# Patient Record
Sex: Female | Born: 1995 | Race: Black or African American | Hispanic: No | Marital: Single | Smoking: Former smoker
Health system: Southern US, Community
[De-identification: ages and names within clinical notes are randomized; demographics above are authoritative.]

## PROBLEM LIST (undated history)

## (undated) ENCOUNTER — Emergency Department (HOSPITAL_COMMUNITY): Admission: EM | Payer: BC Managed Care – PPO

## (undated) ENCOUNTER — Inpatient Hospital Stay (HOSPITAL_COMMUNITY): Payer: Self-pay

## (undated) ENCOUNTER — Ambulatory Visit (HOSPITAL_COMMUNITY): Disposition: A | Discharge status: Home / Self Care | Payer: BLUE CROSS/BLUE SHIELD

## (undated) DIAGNOSIS — B999 Unspecified infectious disease: Secondary | ICD-10-CM

## (undated) DIAGNOSIS — I10 Essential (primary) hypertension: Secondary | ICD-10-CM

## (undated) DIAGNOSIS — A749 Chlamydial infection, unspecified: Secondary | ICD-10-CM

## (undated) HISTORY — PX: WISDOM TOOTH EXTRACTION: SHX21

## (undated) HISTORY — PX: INDUCED ABORTION: SHX677

---
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
## 2014-01-22 ENCOUNTER — Ambulatory Visit (INDEPENDENT_AMBULATORY_CARE_PROVIDER_SITE_OTHER): Payer: BC Managed Care – PPO | Admitting: Internal Medicine

## 2014-01-22 VITALS — BP 130/82 | HR 84 | Temp 98.4°F | Resp 18 | Ht 65.75 in | Wt 224.4 lb

## 2014-01-22 DIAGNOSIS — R350 Frequency of micturition: Secondary | ICD-10-CM

## 2014-01-22 DIAGNOSIS — N390 Urinary tract infection, site not specified: Secondary | ICD-10-CM

## 2014-01-22 DIAGNOSIS — R319 Hematuria, unspecified: Secondary | ICD-10-CM

## 2014-01-22 LAB — POCT UA - MICROSCOPIC ONLY
CRYSTALS, UR, HPF, POC: NEGATIVE
Casts, Ur, LPF, POC: NEGATIVE
RBC, urine, microscopic: NEGATIVE
YEAST UA: NEGATIVE

## 2014-01-22 LAB — POCT URINALYSIS DIPSTICK
BILIRUBIN UA: NEGATIVE
GLUCOSE UA: NEGATIVE
NITRITE UA: NEGATIVE
PH UA: 7
Protein, UA: NEGATIVE
Spec Grav, UA: 1.015
Urobilinogen, UA: 0.2

## 2014-01-22 MED ORDER — SULFAMETHOXAZOLE-TMP DS 800-160 MG PO TABS: 1.0000 | ORAL_TABLET | Freq: Two times a day (BID) | ORAL | Status: DC

## 2014-01-22 NOTE — Progress Notes (Signed)
Subjective:    Patient ID: Sabrina Coleman, female    DOB: 1995-12-27, 18 y.o.   MRN: [id number]  HPI This chart was scribed for Ellamae Siaobert Jalasia Eskridge, MD, by Phillis HaggisGabriella Gaje, ED Scribe. This patient was seen in room 3 and the patient's care was started at 4:31 PM.  HPI Comments: Sabrina Coleman is a 18 y.o. female who presents to the [facility] complaining of abdominal pain and urinary frequency-2-3 days Had a UTI where pain was frequent, went on medication where pain subsided 4/15 Reports that she was on Macrobid from that last visit She denies history of kidney stones Drinks dark soda despite being told not to  Denies dyspareunia and dysuria Denies new sexual partner many mos-see neg STI screen Denies family history of kidney stones  uti in 1/15 also---resp to there Holds urine to work Sex activ < q 2weeks-same partner   Review of Systems  Constitutional: Negative for fever, chills, diaphoresis, activity change, appetite change, fatigue and unexpected weight change.  Respiratory: Negative for shortness of breath.   Cardiovascular: Negative for palpitations.  Gastrointestinal: Negative for nausea, vomiting and rectal pain.  Genitourinary: Negative for vaginal bleeding, vaginal discharge, enuresis, genital sores, vaginal pain, menstrual problem and dyspareunia.  Musculoskeletal: Negative for back pain.      Objective:   Physical Exam  Constitutional: She is oriented to person, place, and time. She appears well-developed and well-nourished. No distress.  obese  Eyes: Pupils are equal, round, and reactive to light.  Neck: No thyromegaly present.  Cardiovascular: Normal rate and regular rhythm.   Pulmonary/Chest: Effort normal and breath sounds normal. No respiratory distress.  Abdominal: Soft. Bowel sounds are normal. She exhibits no distension. There is no rebound, no guarding and no CVA tenderness.  Mild LLQ tenderness to deep palpation.   Musculoskeletal: She  exhibits no edema.  Lymphadenopathy:    She has no cervical adenopathy.  Neurological: She is alert and oriented to person, place, and time.   Results for orders placed in visit on 01/22/14  POCT UA - MICROSCOPIC ONLY      Result Value Ref Range   WBC, Ur, HPF, POC 25-30     RBC, urine, microscopic neg     Bacteria, U Microscopic 3+     Mucus, UA moderate     Epithelial cells, urine per micros 13-17     Crystals, Ur, HPF, POC neg     Casts, Ur, LPF, POC neg     Yeast, UA neg    POCT URINALYSIS DIPSTICK      Result Value Ref Range   Color, UA yellow     Clarity, UA cloudy     Glucose, UA neg     Bilirubin, UA neg     Ketones, UA eng     Spec Grav, UA 1.015     Blood, UA small     pH, UA 7.0     Protein, UA neg     Urobilinogen, UA 0.2     Nitrite, UA neg     Leukocytes, UA large (3+)         Assessment & Plan:  Frequent urination -pyuria  Suggests relapse since macrodan 4/15 not sens to bact  Meds ordered this encounter  Medications  . sulfamethoxazole-trimethoprim (BACTRIM DS) 800-160 MG per tablet    Sig: Take 1 tablet by mouth 2 (two) times daily.    Dispense:  20 tablet    Refill:  0  Call after  cult

## 2014-01-25 LAB — URINE CULTURE: Colony Count: >=100000

## 2014-01-30 ENCOUNTER — Encounter: Payer: Self-pay | Admitting: *Deleted

## 2015-05-06 ENCOUNTER — Ambulatory Visit (INDEPENDENT_AMBULATORY_CARE_PROVIDER_SITE_OTHER): Payer: BLUE CROSS/BLUE SHIELD | Admitting: Family Medicine

## 2015-05-06 VITALS — BP 128/72 | HR 62 | Temp 97.9°F | Resp 16 | Ht 65.0 in | Wt 212.0 lb

## 2015-05-06 DIAGNOSIS — Z3049 Encounter for surveillance of other contraceptives: Secondary | ICD-10-CM | POA: Diagnosis not present

## 2015-05-06 DIAGNOSIS — Z23 Encounter for immunization: Secondary | ICD-10-CM | POA: Diagnosis not present

## 2015-05-06 DIAGNOSIS — Z113 Encounter for screening for infections with a predominantly sexual mode of transmission: Secondary | ICD-10-CM | POA: Diagnosis not present

## 2015-05-06 DIAGNOSIS — A599 Trichomoniasis, unspecified: Secondary | ICD-10-CM

## 2015-05-06 DIAGNOSIS — Z3046 Encounter for surveillance of implantable subdermal contraceptive: Secondary | ICD-10-CM

## 2015-05-06 NOTE — Patient Instructions (Addendum)
Come back in 1 year for your pap smear/pelvic. If you meet a nice deserving young man with a good job, come back and see me prior. Leave on your pressure dressing x 1 day. You should then leave the steri strips in place until they naturally fall off - 1 wk. You can wash normally but try not to get them to wet or leave them wet (which will make them come off sooner). If the incision site becomes painful and swollen or a hard mass, then take an aspirin a day and warm wet compress. RTC immed for any increased pain, redness, swelling, or purulent drainage. If you have any questions or concerns, don't hesitate to call.  Contraceptive Implant Information A contraceptive implant is a plastic rod that is inserted under your skin. It is usually inserted under the skin of your upper arm. It continually releases small amounts of progestin (synthetic progesterone) into your bloodstream. This prevents an egg from being released from your ovaries. It also thickens your cervical mucus to prevent sperm from entering the cervix, and it thins your uterine lining to prevent a fertilized egg from attaching to your uterus. Contraceptive implants can be effective for up to 3 years. They do not provide protection against sexually transmitted diseases (STDs).  The procedure to insert an implant usually takes about 10 minutes. There may be minor bruising, swelling, and discomfort at the insertion site for a couple days. The implant begins to work within the first day. Other contraceptive protection may be necessary for 7 days. Be sure to discuss with your health care provider if you need a backup method of contraception.  Your health care provider will make sure you are a good candidate for the contraceptive implant. Discuss with your health care provider the possible side effects of the implant. ADVANTAGES  It prevents pregnancy for up to 3 years.  It is easily reversible.  It is convenient.  It can be used when  breastfeeding.  It can be used by women who cannot take estrogen. DISADVANTAGES  You may have irregular or unplanned vaginal bleeding.  You may develop side effects, including headache, weight gain, acne, breast tenderness, or mood changes.  You may have tissue or nerve damage after insertion (rare).  It may be difficult and uncomfortable to remove.  Certain medicines may interfere with the effectiveness of the implant. REMOVAL OF IMPLANT The implant should be removed in 3 years or as directed by your health care provider. The implant's effect wears off in a few hours after removal. Your ability to get pregnant (fertility) may be restored in 1-2 weeks. A new implant can be inserted as soon as the old one is removed if desired. CONTRAINDICATIONS You should not get the implant if you are experiencing any of the following situations:  You are pregnant.  You have a history of breast cancer, osteoporosis, blood clots, heart disease, diabetes, high blood pressure, liver disease, tumors, or stroke.   You have undiagnosed vaginal bleeding.  You have a sensitivity to any part of the implant.   This information is not intended to replace advice given to you by your health care provider. Make sure you discuss any questions you have with your health care provider.   Document Released: 06/26/2011 Document Revised: 03/09/2013 Document Reviewed: 01/03/2013 Elsevier Interactive Patient Education [employer]  Contraception Choices Contraception (birth control) is the use of any methods or devices to prevent pregnancy. Below are some methods to help avoid pregnancy. HORMONAL METHODS  Contraceptive implant. This is a thin, plastic tube containing progesterone hormone. It does not contain estrogen hormone. Your health care provider inserts the tube in the inner part of the upper arm. The tube can remain in place for up to 3 years. After 3 years, the implant must be removed. The implant  prevents the ovaries from releasing an egg (ovulation), thickens the cervical mucus to prevent sperm from entering the uterus, and thins the lining of the inside of the uterus.  Progesterone-only injections. These injections are given every 3 months by your health care provider to prevent pregnancy. This synthetic progesterone hormone stops the ovaries from releasing eggs. It also thickens cervical mucus and changes the uterine lining. This makes it harder for sperm to survive in the uterus.  Birth control pills. These pills contain estrogen and progesterone hormone. They work by preventing the ovaries from releasing eggs (ovulation). They also cause the cervical mucus to thicken, preventing the sperm from entering the uterus. Birth control pills are prescribed by a health care provider.Birth control pills can also be used to treat heavy periods.  Minipill. This type of birth control pill contains only the progesterone hormone. They are taken every day of each month and must be prescribed by your health care provider.  Birth control patch. The patch contains hormones similar to those in birth control pills. It must be changed once a week and is prescribed by a health care provider.  Vaginal ring. The ring contains hormones similar to those in birth control pills. It is left in the vagina for 3 weeks, removed for 1 week, and then a new one is put back in place. The patient must be comfortable inserting and removing the ring from the vagina.A health care provider's prescription is necessary.  Emergency contraception. Emergency contraceptives prevent pregnancy after unprotected sexual intercourse. This pill can be taken right after sex or up to 5 days after unprotected sex. It is most effective the sooner you take the pills after having sexual intercourse. Most emergency contraceptive pills are available without a prescription. Check with your pharmacist. Do not use emergency contraception as your only  form of birth control. BARRIER METHODS   Female condom. This is a thin sheath (latex or rubber) that is worn over the penis during sexual intercourse. It can be used with spermicide to increase effectiveness.  Female condom. This is a soft, loose-fitting sheath that is put into the vagina before sexual intercourse.  Diaphragm. This is a soft, latex, dome-shaped barrier that must be fitted by a health care provider. It is inserted into the vagina, along with a spermicidal jelly. It is inserted before intercourse. The diaphragm should be left in the vagina for 6 to 8 hours after intercourse.  Cervical cap. This is a round, soft, latex or plastic cup that fits over the cervix and must be fitted by a health care provider. The cap can be left in place for up to 48 hours after intercourse.  Sponge. This is a soft, circular piece of polyurethane foam. The sponge has spermicide in it. It is inserted into the vagina after wetting it and before sexual intercourse.  Spermicides. These are chemicals that kill or block sperm from entering the cervix and uterus. They come in the form of creams, jellies, suppositories, foam, or tablets. They do not require a prescription. They are inserted into the vagina with an applicator before having sexual intercourse. The process must be repeated every time you have sexual intercourse. INTRAUTERINE CONTRACEPTION  Intrauterine device (IUD). This is a T-shaped device that is put in a woman's uterus during a menstrual period to prevent pregnancy. There are 2 types:  Copper IUD. This type of IUD is wrapped in copper wire and is placed inside the uterus. Copper makes the uterus and fallopian tubes produce a fluid that kills sperm. It can stay in place for 10 years.  Hormone IUD. This type of IUD contains the hormone progestin (synthetic progesterone). The hormone thickens the cervical mucus and prevents sperm from entering the uterus, and it also thins the uterine lining to  prevent implantation of a fertilized egg. The hormone can weaken or kill the sperm that get into the uterus. It can stay in place for 3-5 years, depending on which type of IUD is used. PERMANENT METHODS OF CONTRACEPTION  Female tubal ligation. This is when the woman's fallopian tubes are surgically sealed, tied, or blocked to prevent the egg from traveling to the uterus.  Hysteroscopic sterilization. This involves placing a small coil or insert into each fallopian tube. Your doctor uses a technique called hysteroscopy to do the procedure. The device causes scar tissue to form. This results in permanent blockage of the fallopian tubes, so the sperm cannot fertilize the egg. It takes about 3 months after the procedure for the tubes to become blocked. You must use another form of birth control for these 3 months.  Female sterilization. This is when the female has the tubes that carry sperm tied off (vasectomy).This blocks sperm from entering the vagina during sexual intercourse. After the procedure, the man can still ejaculate fluid (semen). NATURAL PLANNING METHODS  Natural family planning. This is not having sexual intercourse or using a barrier method (condom, diaphragm, cervical cap) on days the woman could become pregnant.  Calendar method. This is keeping track of the length of each menstrual cycle and identifying when you are fertile.  Ovulation method. This is avoiding sexual intercourse during ovulation.  Symptothermal method. This is avoiding sexual intercourse during ovulation, using a thermometer and ovulation symptoms.  Post-ovulation method. This is timing sexual intercourse after you have ovulated. Regardless of which type or method of contraception you choose, it is important that you use condoms to protect against the transmission of sexually transmitted infections (STIs). Talk with your health care provider about which form of contraception is most appropriate for you.   This  information is not intended to replace advice given to you by your health care provider. Make sure you discuss any questions you have with your health care provider.   Document Released: 07/07/2005 Document Revised: 07/12/2013 Document Reviewed: 12/30/2012 Elsevier Interactive Patient Education 2016 [employer]. Oral Contraception Information Oral contraceptive pills (OCPs) are medicines taken to prevent pregnancy. OCPs work by preventing the ovaries from releasing eggs. The hormones in OCPs also cause the cervical mucus to thicken, preventing the sperm from entering the uterus. The hormones also cause the uterine lining to become thin, not allowing a fertilized egg to attach to the inside of the uterus. OCPs are highly effective when taken exactly as prescribed. However, OCPs do not prevent sexually transmitted diseases (STDs). Safe sex practices, such as using condoms along with the pill, can help prevent STDs.  Before taking the pill, you may have a physical exam and Pap test. Your health care provider may order blood tests. The health care provider will make sure you are a good candidate for oral contraception. Discuss with your health care provider the possible side effects of  the OCP you may be prescribed. When starting an OCP, it can take 2 to 3 months for the body to adjust to the changes in hormone levels in your body.  TYPES OF ORAL CONTRACEPTION  The combination pill--This pill contains estrogen and progestin (synthetic progesterone) hormones. The combination pill comes in 21-day, 28-day, or 91-day packs. Some types of combination pills are meant to be taken continuously (365-day pills). With 21-day packs, you do not take pills for 7 days after the last pill. With 28-day packs, the pill is taken every day. The last 7 pills are without hormones. Certain types of pills have more than 21 hormone-containing pills. With 91-day packs, the first 84 pills contain both hormones, and the last 7 pills  contain no hormones or contain estrogen only.  The minipill--This pill contains the progesterone hormone only. The pill is taken every day continuously. It is very important to take the pill at the same time each day. The minipill comes in packs of 28 pills. All 28 pills contain the hormone.  ADVANTAGES OF ORAL CONTRACEPTIVE PILLS  Decreases premenstrual symptoms.   Treats menstrual period cramps.   Regulates the menstrual cycle.   Decreases a heavy menstrual flow.   May treatacne, depending on the type of pill.   Treats abnormal uterine bleeding.   Treats polycystic ovarian syndrome.   Treats endometriosis.   Can be used as emergency contraception.  THINGS THAT CAN MAKE ORAL CONTRACEPTIVE PILLS LESS EFFECTIVE OCPs can be less effective if:   You forget to take the pill at the same time every day.   You have a stomach or intestinal disease that lessens the absorption of the pill.   You take OCPs with other medicines that make OCPs less effective, such as antibiotics, certain HIV medicines, and some seizure medicines.   You take expired OCPs.   You forget to restart the pill on day 7, when using the packs of 21 pills.  RISKS ASSOCIATED WITH ORAL CONTRACEPTIVE PILLS  Oral contraceptive pills can sometimes cause side effects, such as:  Headache.  Nausea.  Breast tenderness.  Irregular bleeding or spotting. Combination pills are also associated with a small increased risk of:  Blood clots.  Heart attack.  Stroke.   This information is not intended to replace advice given to you by your health care provider. Make sure you discuss any questions you have with your health care provider.   Document Released: 09/27/2002 Document Revised: 04/27/2013 Document Reviewed: 12/26/2012 Elsevier Interactive Patient Education [employer]

## 2015-05-06 NOTE — Progress Notes (Signed)
Subjective:  This chart was scribed for Sabrina Sorenson, MD by Sabrina Coleman, Medical Scribe. This patient was seen in Room 8 and the patient's care was started 4:27 PM.    Patient ID: Sabrina Coleman, female    DOB: 01-02-1996, 19 y.o.   MRN: [id number] Chief Complaint  Patient presents with  . explanon removal    pt would like to get implant removed  . std check  . Immunizations    flu shot    HPI Sabrina Coleman is a 19 y.o. female who presents to [facility] requesting nexplanon removal. She's had it in since Oct 2014 but for the past 2 mos she has been having increasingly freq burning around the rod. She plans on just using condoms for birth control. She denies being in a relationship at the moment. She wants to have STD check.  She was on Depo prior but had a lot of weight gain w/ that. No weight gain on nexplanon but is having spotting and bleeding after intercourse which she dislikes immensely.  She received a flu vaccine today.   History reviewed. No pertinent past medical history. Prior to Admission medications   Medication Sig Start Date End Date Taking? Authorizing Provider  etonogestrel (NEXPLANON) 68 MG IMPL implant Inject 1 each into the skin once.    Historical Provider, MD  fluconazole (DIFLUCAN) 150 MG tablet Take 1 tablet (150 mg total) by mouth once. 11/15/13   Sabrina Pearson, MD  nitrofurantoin, macrocrystal-monohydrate, (MACROBID) 100 MG capsule Take 1 capsule (100 mg total) by mouth 2 (two) times daily. 11/15/13   Sabrina Pearson, MD  sulfamethoxazole-trimethoprim (BACTRIM DS) 800-160 MG per tablet Take 1 tablet by mouth 2 (two) times daily. 01/22/14   Sabrina Pearson, MD   No Known Allergies   Review of Systems  Constitutional: Negative for fever, chills, diaphoresis and fatigue.  Respiratory: Negative for cough, chest tightness, shortness of breath and wheezing.   Gastrointestinal: Negative for nausea, vomiting, diarrhea and constipation.  Genitourinary: Positive  for vaginal bleeding and menstrual problem. Negative for dyspareunia.  Musculoskeletal: Positive for myalgias. Negative for joint swelling.  Skin: Negative for rash and wound.       Objective:   Physical Exam  Constitutional: She is oriented to person, place, and time. She appears well-developed and well-nourished. No distress.  HENT:  Head: Normocephalic and atraumatic.  Eyes: EOM are normal. Pupils are equal, round, and reactive to light.  Neck: Neck supple.  Cardiovascular: Normal rate.   Pulmonary/Chest: Effort normal. No respiratory distress.  Musculoskeletal: Normal range of motion.  Neurological: She is alert and oriented to person, place, and time.  Skin: Skin is warm and dry.  Psychiatric: She has a normal mood and affect. Her behavior is normal.  Nursing note and vitals reviewed.   BP 128/72 mmHg  Pulse 62  Temp(Src) 97.9 F (36.6 C) (Oral)  Resp 16  Ht  (1.651 m)  Wt 212 lb (96.163 kg)  BMI 35.28 kg/m2  SpO2 98%       Assessment & Plan:   1. Family planning, subdermal contraceptive checking/reinsertion/removal - pt with new burning around nexplanon placed 2 yrs prior and not currently sexually active so would like nexplanon removed today which we did w/o sig complication - pt tolerated procedure well. Going to practice abstinence for now and will rtc ot start other form of contraception prior to resumption of sexual activity as well as using condoms every time but gained weight on depo  so wants to trial off hormones for now.   2. Screen for STD (sexually transmitted disease) - last pelvic 2 yrs ago, rtc in 1 yr after 20st birthday for pap  3. Need for influenza vaccination     Orders Placed This Encounter  Procedures  . GC/Chlamydia Probe Amp  . Trichomonas vaginalis, RNA  . Flu Vaccine QUAD 36+ mos IM  . HIV antibody  . Hepatitis C antibody  . RPR     I personally performed the services described in this documentation, which was scribed in my  presence. The recorded information has been reviewed and considered, and addended by me as needed.  Sabrina SorensonEva Avriana Joo, MD MPH   By signing my name below, I, Sabrina Oresung-Kai Coleman, attest that this documentation has been prepared under the direction and in the presence of Sabrina SorensonEva Natalye Kott, MD. Electronically Signed: Stann Oresung-Kai Coleman, Scribe. 05/06/2015 , 4:27 PM .

## 2015-05-07 LAB — RPR

## 2015-05-07 LAB — HEPATITIS C ANTIBODY: HCV Ab: NEGATIVE

## 2015-05-08 LAB — HIV ANTIBODY (ROUTINE TESTING W REFLEX): HIV: NONREACTIVE

## 2015-05-08 LAB — GC/CHLAMYDIA PROBE AMP
CT PROBE, AMP APTIMA: NEGATIVE
GC PROBE AMP APTIMA: NEGATIVE

## 2015-05-08 LAB — TRICHOMONAS VAGINALIS, PROBE AMP: T vaginalis RNA: POSITIVE — AB

## 2015-05-09 MED ORDER — METRONIDAZOLE 500 MG PO TABS: 2000.0000 mg | ORAL_TABLET | Freq: Once | ORAL | Status: DC

## 2015-05-09 NOTE — Addendum Note (Signed)
Addended by: Norberto SorensonSHAW, Jannessa Ogden on: 05/09/2015 05:44 AM   Modules accepted: Orders

## 2015-08-16 ENCOUNTER — Ambulatory Visit (INDEPENDENT_AMBULATORY_CARE_PROVIDER_SITE_OTHER): Payer: BLUE CROSS/BLUE SHIELD | Admitting: Physician Assistant

## 2015-08-16 VITALS — BP 120/80 | HR 88 | Temp 97.7°F | Resp 16 | Ht 67.0 in | Wt 195.4 lb

## 2015-08-16 DIAGNOSIS — Z130 Encounter for screening for diseases of the blood and blood-forming organs and certain disorders involving the immune mechanism: Secondary | ICD-10-CM | POA: Diagnosis not present

## 2015-08-16 DIAGNOSIS — N898 Other specified noninflammatory disorders of vagina: Secondary | ICD-10-CM

## 2015-08-16 DIAGNOSIS — Z113 Encounter for screening for infections with a predominantly sexual mode of transmission: Secondary | ICD-10-CM

## 2015-08-16 DIAGNOSIS — Z13228 Encounter for screening for other metabolic disorders: Secondary | ICD-10-CM

## 2015-08-16 DIAGNOSIS — Z0000 Encounter for general adult medical examination without abnormal findings: Secondary | ICD-10-CM

## 2015-08-16 DIAGNOSIS — R070 Pain in throat: Secondary | ICD-10-CM | POA: Diagnosis not present

## 2015-08-16 DIAGNOSIS — Z124 Encounter for screening for malignant neoplasm of cervix: Secondary | ICD-10-CM | POA: Diagnosis not present

## 2015-08-16 DIAGNOSIS — Z1329 Encounter for screening for other suspected endocrine disorder: Secondary | ICD-10-CM

## 2015-08-16 LAB — POCT WET + KOH PREP
Trich by wet prep: ABSENT
YEAST BY KOH: ABSENT
YEAST BY WET PREP: ABSENT

## 2015-08-16 LAB — COMPLETE METABOLIC PANEL WITH GFR
ALT: 9 U/L (ref 6–29)
AST: 12 U/L (ref 10–30)
Albumin: 4 g/dL (ref 3.6–5.1)
Alkaline Phosphatase: 72 U/L (ref 33–115)
BUN: 9 mg/dL (ref 7–25)
CALCIUM: 9.3 mg/dL (ref 8.6–10.2)
CHLORIDE: 103 mmol/L (ref 98–110)
CO2: 25 mmol/L (ref 20–31)
Creat: 0.78 mg/dL (ref 0.50–1.10)
GFR, Est Non African American: >89 mL/min (ref >=60)
Glucose, Bld: 72 mg/dL (ref 65–99)
POTASSIUM: 3.8 mmol/L (ref 3.5–5.3)
Sodium: 140 mmol/L (ref 135–146)
Total Bilirubin: 0.6 mg/dL (ref 0.2–1.2)
Total Protein: 7.7 g/dL (ref 6.1–8.1)

## 2015-08-16 LAB — CBC
HEMATOCRIT: 40 % (ref 36.0–46.0)
Hemoglobin: 13.2 g/dL (ref 12.0–15.0)
MCH: 31.3 pg (ref 26.0–34.0)
MCHC: 33 g/dL (ref 30.0–36.0)
MCV: 94.8 fL (ref 78.0–100.0)
MPV: 10.4 fL (ref 8.6–12.4)
PLATELETS: 301 10*3/uL (ref 150–400)
RBC: 4.22 MIL/uL (ref 3.87–5.11)
RDW: 13.1 % (ref 11.5–15.5)
WBC: 7.7 10*3/uL (ref 4.0–10.5)

## 2015-08-16 LAB — TSH: TSH: 2.36 u[IU]/mL (ref 0.350–4.500)

## 2015-08-16 LAB — POCT RAPID STREP A (OFFICE): RAPID STREP A SCREEN: NEGATIVE

## 2015-08-16 LAB — POCT URINE PREGNANCY: Preg Test, Ur: NEGATIVE

## 2015-08-16 NOTE — Progress Notes (Signed)
[facility] [address] [place] [address] [phone]  Date:  08/16/2015   Name:  Chastin Garlitz   DOB:  Jun 21, 1996   MRN:  [id number]  PCP:  No PCP Per Patient    History of Present Illness:  Maelynn Moroney is a 20 y.o. female patient who presents to [facility] annual physical exam and a test of cure for trichomoniasis.       She had a hx of trichomoniasis 3 months ago, and would like to be rechecked. She now has discharge more often than normal.  Whitish odorless.   Throat pain and bilateral ear pain about 1 month ago, that has left her with a residual hoarseness and sensation in her throat.  Sore throat that lasted for days.  She took 1 amoxicillin for relief.  She had no fever.  The ear pain lasted for several days.  She used sweet oil where she found relief.   Diet: No restrictions.  She is eating everything.  She is not having vegetables daily and may have it every once "in a blue moon".  Hydration is bad.  She tries to drink one cup of water per day.  She drinks a lot of juice and soda.    BM: Normal. Qd, no constipation, diarrhea, or blood in the stool.    Urination: No hematuria, dysuria, or hematuria.   Sleep: No trouble sleeping.  Gets 8 hours.  Work: Chemical engineer.  Spend time with family.  Studying criminal justice in 3rd year.  Transferring [employer].  She will be going to [employer].    EtOH use: very rare "blue moon" Tobacco use: no tobacco use Illicit drug use: cannabis, "alot" multiple times per day.    Sexually active: none at this time.           Patient Active Problem List   Diagnosis Date Noted  . BMI 35.0-35.9,adult 11/15/2013    History reviewed. No pertinent past medical history.  History reviewed. No pertinent past surgical history.  Social History  Substance Use Topics  . Smoking status: Never Smoker   . Smokeless tobacco: None  . Alcohol Use: No    Family History  Problem Relation Age of Onset  . Cancer Maternal  Grandmother     No Known Allergies  Medication list has been reviewed and updated.  Current Outpatient Prescriptions on File Prior to Visit  Medication Sig Dispense Refill  . etonogestrel (NEXPLANON) 68 MG IMPL implant Inject 1 each into the skin once. Reported on 08/16/2015    . fluconazole (DIFLUCAN) 150 MG tablet Take 1 tablet (150 mg total) by mouth once. (Patient not taking: Reported on 05/06/2015) 1 tablet 1  . metroNIDAZOLE (FLAGYL) 500 MG tablet Take 4 tablets (2,000 mg total) by mouth once. (Patient not taking: Reported on 08/16/2015) 4 tablet 0  . nitrofurantoin, macrocrystal-monohydrate, (MACROBID) 100 MG capsule Take 1 capsule (100 mg total) by mouth 2 (two) times daily. (Patient not taking: Reported on 05/06/2015) 14 capsule 0  . sulfamethoxazole-trimethoprim (BACTRIM DS) 800-160 MG per tablet Take 1 tablet by mouth 2 (two) times daily. (Patient not taking: Reported on 05/06/2015) 20 tablet 0   No current facility-administered medications on file prior to visit.    Review of Systems  Constitutional: Negative for fever and chills.  HENT: Positive for ear pain and sore throat. Negative for ear discharge.   Eyes: Negative for blurred vision and double vision.  Respiratory: Negative for cough, shortness of breath  and wheezing.   Cardiovascular: Negative for chest pain, palpitations and leg swelling.  Gastrointestinal: Negative for nausea, vomiting and diarrhea.  Genitourinary: Negative for dysuria, frequency and hematuria.  Skin: Negative for itching and rash.  Neurological: Negative for dizziness and headaches.   Physical Examination: BP 120/80 mmHg  Pulse 88  Temp(Src) 97.7 F (36.5 C) (Oral)  Resp 16  Ht  (1.702 m)  Wt 195 lb 6.4 oz (88.633 kg)  BMI 30.60 kg/m2  SpO2 97%  LMP 07/31/2015 Ideal Body Weight: Weight in (lb) to have BMI = 25: 159.3  Physical Exam  Constitutional: She is oriented to person, place, and time. She appears well-developed and  well-nourished. No distress.  HENT:  Head: Normocephalic and atraumatic.  Right Ear: Tympanic membrane, external ear and ear canal normal.  Left Ear: Tympanic membrane, external ear and ear canal normal.  Nose: Mucosal edema and rhinorrhea present. Right sinus exhibits no maxillary sinus tenderness and no frontal sinus tenderness. Left sinus exhibits no maxillary sinus tenderness and no frontal sinus tenderness.  Mouth/Throat: Oropharynx is clear and moist. No uvula swelling. No oropharyngeal exudate, posterior oropharyngeal edema or posterior oropharyngeal erythema.  Eyes: Conjunctivae and EOM are normal. Pupils are equal, round, and reactive to light.  Neck: Normal range of motion. Neck supple. No thyromegaly present.  Cardiovascular: Normal rate, regular rhythm, normal heart sounds and intact distal pulses.  Exam reveals no gallop, no distant heart sounds and no friction rub.   No murmur heard. Pulmonary/Chest: Effort normal and breath sounds normal. No respiratory distress. She has no decreased breath sounds. She has no wheezes. She has no rhonchi.  Abdominal: Soft. Bowel sounds are normal. She exhibits no distension and no mass. There is no tenderness.  Genitourinary: Pelvic exam was performed with patient supine. There is no rash on the right labia. There is no rash on the left labia. Cervix exhibits discharge and friability. Cervix exhibits no motion tenderness. Right adnexum displays no mass and no tenderness. Left adnexum displays no mass and no tenderness. Vaginal discharge found.  Substantial erythema surrounding the cervical os.  Whitish yellow discharge within the vaginal vault, but none from cervix at this time.    Musculoskeletal: Normal range of motion. She exhibits no edema or tenderness.  Lymphadenopathy:       Head (right side): No submandibular, no tonsillar, no preauricular and no posterior auricular adenopathy present.       Head (left side): No submandibular, no tonsillar, no  preauricular and no posterior auricular adenopathy present.    She has no cervical adenopathy.  Neurological: She is alert and oriented to person, place, and time. No cranial nerve deficit. She exhibits normal muscle tone. Coordination normal.  Skin: Skin is warm and dry. She is not diaphoretic.  Psychiatric: She has a normal mood and affect. Her behavior is normal.    Results for orders placed or performed in visit on 08/16/15  POCT rapid strep A  Result Value Ref Range   Rapid Strep A Screen Negative Negative  POCT Wet + KOH Prep  Result Value Ref Range   Yeast by KOH Absent Present, Absent   Yeast by wet prep Absent Present, Absent   WBC by wet prep Moderate (A) None, Few, Too numerous to count   Clue Cells Wet Prep HPF POC None None, Too numerous to count   Trich by wet prep Absent Present, Absent   Bacteria Wet Prep HPF POC Few None, Few, Too numerous to count  Epithelial Cells By [employer] Pref ([facility]FC) Moderate (A) None, Few, Too numerous to count   RBC,UR,HPF,POC None None RBC/hpf    Assessment and Plan: Ronia Hazelett is a 20 y.o. female who is here today for an annual physical exam, concern of strep throat, and test of cure for trichomoniasis. -placing pap, and will await results.  This could be trichomoniasis.  We will also culture strep at this time.     Annual physical exam - Plan: CBC, COMPLETE METABOLIC PANEL WITH GFR, HIV antibody, TSH, RPR, Trichomonas vaginalis, RNA, POCT Wet + KOH Prep, Pap IG and Chlamydia/Gonococcus, NAA, POCT urine pregnancy  Throat pain - Plan: POCT rapid strep A, Culture, Group A Strep  Screening for thyroid disorder - Plan: TSH  Screening for STD (sexually transmitted disease) - Plan: HIV antibody, RPR, Trichomonas vaginalis, RNA, Pap IG and Chlamydia/Gonococcus, NAA  Screening for metabolic disorder - Plan: COMPLETE METABOLIC PANEL WITH GFR  Screening for deficiency anemia - Plan: CBC  Vaginal discharge - Plan: POCT Wet + KOH Prep, POCT urine  pregnancy   Trena Platt, PA-C [facility] 1/26/20174:14 PM

## 2015-08-16 NOTE — Patient Instructions (Signed)

## 2015-08-17 LAB — PAP IG AND CT-NG NAA
CHLAMYDIA PROBE AMP: DETECTED — AB
GC Probe Amp: NOT DETECTED

## 2015-08-17 LAB — RPR

## 2015-08-17 LAB — HIV ANTIBODY (ROUTINE TESTING W REFLEX): HIV: NONREACTIVE

## 2015-08-17 LAB — TRICHOMONAS VAGINALIS, PROBE AMP: TRICHOMONAS VAGINALIS PROBE APTIMA: NEGATIVE

## 2015-08-18 LAB — CULTURE, GROUP A STREP: ORGANISM ID, BACTERIA: NORMAL

## 2015-08-19 ENCOUNTER — Encounter: Payer: Self-pay | Admitting: Physician Assistant

## 2015-08-23 ENCOUNTER — Other Ambulatory Visit: Payer: Self-pay | Admitting: Physician Assistant

## 2015-08-23 DIAGNOSIS — A749 Chlamydial infection, unspecified: Secondary | ICD-10-CM

## 2015-08-23 MED ORDER — AZITHROMYCIN 250 MG PO TABS: 1000.0000 mg | ORAL_TABLET | Freq: Once | ORAL | Status: DC

## 2015-08-23 NOTE — Progress Notes (Signed)
PLEASE send chlamydia result to [facility].  Mother is on ROI, 10/16.  Patient's phone is off.  Left message of chlamydia with mother.  Treatment sent to pharmacy.  Trich, RPR, HIV negative.  Normal kidney, and liver function.  Thyroid function is normal.  Mother had confided that patient has been an A Consulting civil engineer in college.  She started dating a young man, who engaged in cannabis use where she picked up a strong habit.  She now has dropped out of school.  She lives on her own.   Advised that patient schedule with me next door for 15 minute session.

## 2015-08-27 NOTE — Progress Notes (Signed)
I have [facility] to [facility]

## 2015-09-24 ENCOUNTER — Ambulatory Visit (INDEPENDENT_AMBULATORY_CARE_PROVIDER_SITE_OTHER): Payer: BLUE CROSS/BLUE SHIELD | Admitting: Physician Assistant

## 2015-09-24 VITALS — BP 116/65 | HR 60 | Temp 97.7°F | Resp 16 | Ht 65.5 in | Wt 186.4 lb

## 2015-09-24 DIAGNOSIS — N926 Irregular menstruation, unspecified: Secondary | ICD-10-CM

## 2015-09-24 DIAGNOSIS — Z3049 Encounter for surveillance of other contraceptives: Secondary | ICD-10-CM | POA: Diagnosis not present

## 2015-09-24 DIAGNOSIS — Z113 Encounter for screening for infections with a predominantly sexual mode of transmission: Secondary | ICD-10-CM | POA: Diagnosis not present

## 2015-09-24 LAB — POCT URINE PREGNANCY: PREG TEST UR: NEGATIVE

## 2015-09-24 MED ORDER — ETONOGESTREL-ETHINYL ESTRADIOL 0.12-0.015 MG/24HR VA RING: VAGINAL_RING | VAGINAL | Status: DC

## 2015-09-24 NOTE — Progress Notes (Signed)
[facility] [address], [place] [id number] [phone]  Date:  09/24/2015   Name:  Sabrina Coleman   DOB:  08/07/1995   MRN:  [id number]  PCP:  No PCP Per Patient    Chief Complaint: Possible Pregnancy and std check   History of Present Illness:  This is a 20 y.o. female with PMH obesity who is presenting with menstrual irregularity. Pt was supposed to start period 1 week ago. A couple days after she was supposed to start, she got some spotting. 2 days later that stopped. Started spotting again today. This is not normal for her. She is not having any premenstrual symptoms. She denies abdominal cramping, breast tenderness, back pain. Pt got nexplanon removed 04/2015 but did not get replaced. Has not been taking anything for birth control since. Had regularly monthly periods after getting nexplanon removed. Pt states she had a lot of sex last month with a new female partner. They did not use condoms. She is very worried she is pregnant. She is wanting STD testing today. Last had STD testing 04/2015 and was positive for trich, treated. She is interested in starting nuva ring today. She denies vaginal discharge, dysuria, urinary freq.  LMP 1/31 - 2/7.  Review of Systems:  Review of Systems See HPI  Patient Active Problem List   Diagnosis Date Noted  . BMI 35.0-35.9,adult 11/15/2013    Prior to Admission medications   Medication Sig Start Date End Date Taking? Authorizing Provider  etonogestrel (NEXPLANON) 68 MG IMPL implant Inject 1 each into the skin once. Reported on 08/16/2015    Historical Provider, MD  fluconazole (DIFLUCAN) 150 MG tablet Take 1 tablet (150 mg total) by mouth once. Patient not taking: Reported on 05/06/2015 11/15/13   Tonye Pearsonobert P Doolittle, MD    No Known Allergies  History reviewed. No pertinent past surgical history.  Social History  Substance Use Topics  . Smoking status: Never Smoker   . Smokeless tobacco: None  . Alcohol Use: No     Family History  Problem Relation Age of Onset  . Cancer Maternal Grandmother     Medication list has been reviewed and updated.  Physical Examination:  Physical Exam  Constitutional: She is oriented to person, place, and time. She appears well-developed and well-nourished. No distress.  HENT:  Head: Normocephalic and atraumatic.  Right Ear: Hearing normal.  Left Ear: Hearing normal.  Nose: Nose normal.  Eyes: Conjunctivae and lids are normal. Right eye exhibits no discharge. Left eye exhibits no discharge. No scleral icterus.  Pulmonary/Chest: Effort normal. No respiratory distress.  Musculoskeletal: Normal range of motion.  Neurological: She is alert and oriented to person, place, and time.  Skin: Skin is warm, dry and intact. No lesion and no rash noted.  Psychiatric: She has a normal mood and affect. Her speech is normal and behavior is normal. Thought content normal.    BP 116/65 mmHg  Pulse 60  Temp(Src) 97.7 F (36.5 C) (Oral)  Resp 16  Ht 5' 5.5" (1.664 m)  Wt 186 lb 6.4 oz (84.55 kg)  BMI 30.54 kg/m2  LMP 09/21/2015  Assessment and Plan:  1. Missed period 2. Screen for STD 3. Encounter for surveillance of other contraceptive Urine pregnancy negative. Serum HCG negative. STD testing negative. She wishes to start on nuvaring today. We discussed the importance of safe sex practices. Return in 1 year for refill nuvaring or sooner if needed. - POCT urine pregnancy - hCG, quantitative,  pregnancy - HIV antibody - RPR - GC/Chlamydia Probe Amp - Trichomonas vaginalis, RNA - etonogestrel-ethinyl estradiol (NUVARING) 0.12-0.015 MG/24HR vaginal ring; Insert vaginally and leave in place for 3 consecutive weeks, then remove for 1 week.  Dispense: 1 each; Refill: 12   Nicole V. Dyke Brackett, MHS [facility]  10/03/2015

## 2015-09-24 NOTE — Patient Instructions (Signed)
SAFE SEX!! Insert nuvaring and keep inside for 3 weeks. Take out for 4th week, then re-insert. Use back up birth control for first month. Use condoms for new partners especially to protect against STDs i will call you with the results of your lab tests.

## 2015-09-25 LAB — HCG, QUANTITATIVE, PREGNANCY: hCG, Beta Chain, Quant, S: <2 m[IU]/mL

## 2015-09-25 LAB — HIV ANTIBODY (ROUTINE TESTING W REFLEX): HIV: NONREACTIVE

## 2015-09-26 ENCOUNTER — Telehealth: Payer: Self-pay | Admitting: *Deleted

## 2015-09-26 LAB — GC/CHLAMYDIA PROBE AMP
CT PROBE, AMP APTIMA: NOT DETECTED
GC PROBE AMP APTIMA: NOT DETECTED

## 2015-09-26 LAB — RPR

## 2015-09-26 LAB — TRICHOMONAS VAGINALIS, PROBE AMP: TRICHOMONAS VAGINALIS PROBE APTIMA: NEGATIVE

## 2015-09-26 NOTE — Telephone Encounter (Signed)
Pt calling for lab results.

## 2015-09-27 NOTE — Telephone Encounter (Signed)
Pt called again about her labs.  I advised the pt that the doc has not reviewed them yet.  Pt understood.

## 2015-09-27 NOTE — Telephone Encounter (Signed)
Please call pt and let her know all testing negative. 1. STD testing negative 2. She is not pregnant.

## 2015-09-27 NOTE — Telephone Encounter (Signed)
Notified pt of results 

## 2015-10-25 ENCOUNTER — Inpatient Hospital Stay (HOSPITAL_COMMUNITY)
Admission: AD | Admit: 2015-10-25 | Discharge: 2015-10-25 | Disposition: A | Discharge status: Home / Self Care | Payer: Self-pay | Source: Ambulatory Visit | Attending: Obstetrics & Gynecology | Admitting: Obstetrics & Gynecology

## 2015-10-25 DIAGNOSIS — Z809 Family history of malignant neoplasm, unspecified: Secondary | ICD-10-CM | POA: Insufficient documentation

## 2015-10-25 DIAGNOSIS — N912 Amenorrhea, unspecified: Secondary | ICD-10-CM | POA: Insufficient documentation

## 2015-10-25 NOTE — MAU Provider Note (Signed)
  History     CSN: [id number]  Arrival date and time: 10/25/15 1951   None     No chief complaint on file.  HPI Comments: Sabrina Coleman is a 20 y.o. Who presents today for pregnancy verification. She states that she took a pregnancy test about one hour PTA, and it was positive. She denies any VB or abdominal pain.   Possible Pregnancy This is a new problem. The current episode started today. The problem has been unchanged. Pertinent negatives include no abdominal pain, fever, nausea or vomiting. Nothing aggravates the symptoms. She has tried nothing for the symptoms.   No past medical history on file.  No past surgical history on file.  Family History  Problem Relation Age of Onset  . Cancer Maternal Grandmother     Social History  Substance Use Topics  . Smoking status: Never Smoker   . Smokeless tobacco: Not on file  . Alcohol Use: No    Allergies: No Known Allergies  No prescriptions prior to admission    Review of Systems  Constitutional: Negative for fever.  Gastrointestinal: Negative for nausea, vomiting, abdominal pain, diarrhea and constipation.  Genitourinary: Negative for dysuria, urgency and frequency.   Physical Exam   There were no vitals taken for this visit.  Physical Exam  Nursing note and vitals reviewed. Constitutional: She is oriented to person, place, and time. She appears well-developed and well-nourished. No distress.  HENT:  Head: Normocephalic.  Cardiovascular: Normal rate.   Respiratory: Effort normal.  Musculoskeletal: Normal range of motion.  Neurological: She is alert and oriented to person, place, and time.  Psychiatric: She has a normal mood and affect.    MAU Course  Procedures  MDM D/W the patient that we no longer provide pregnancy verification here in MAU. No emergecy condition present at this time. No abdominal pain, no vaginal bleeding. Patient referred to the clinic for pregnancy verification.   Assessment and Plan    1. Amenorrhea    DC home Comfort measures reviewed  1st Trimester precautions  Fetal kick counts RX: none   Follow-up Information    [facility].   Specialty:  Obstetrics and Gynecology   Contact information:   [address] [place] [id number] [phone]        Tawnya CrookHogan, Heather Donovan 10/25/2015, 8:54 PM

## 2015-10-25 NOTE — MAU Note (Addendum)
PT SAYS  SHE  TOOK UPT AT HOME  AT 7PM-   WAS  POSITIVE.  -   WANTS  TO KNOW    IF   PREG.     HEATHER , CNM IN TRIAGE   WITH ASSESSMENT  NOW.

## 2015-10-26 ENCOUNTER — Ambulatory Visit (INDEPENDENT_AMBULATORY_CARE_PROVIDER_SITE_OTHER): Payer: Self-pay

## 2015-10-26 ENCOUNTER — Encounter: Payer: Self-pay | Admitting: Obstetrics and Gynecology

## 2015-10-26 DIAGNOSIS — Z3201 Encounter for pregnancy test, result positive: Secondary | ICD-10-CM

## 2015-10-26 LAB — POCT PREGNANCY, URINE: PREG TEST UR: POSITIVE — AB

## 2015-10-26 NOTE — Progress Notes (Signed)
Pt presents to clinic today for pregnancy test. Test confirms she is pregnant at [redacted] wks 1day. Pt has been given a letter of verification stating she is currently pregnant. Pt plans to follow up with [facility].

## 2015-11-01 ENCOUNTER — Emergency Department (HOSPITAL_COMMUNITY)
Admission: EM | Admit: 2015-11-01 | Discharge: 2015-11-01 | Disposition: A | Discharge status: Home / Self Care | Payer: BLUE CROSS/BLUE SHIELD | Attending: Emergency Medicine | Admitting: Emergency Medicine

## 2015-11-01 ENCOUNTER — Encounter (HOSPITAL_COMMUNITY): Payer: Self-pay | Admitting: Emergency Medicine

## 2015-11-01 DIAGNOSIS — O2340 Unspecified infection of urinary tract in pregnancy, unspecified trimester: Secondary | ICD-10-CM | POA: Diagnosis not present

## 2015-11-01 DIAGNOSIS — R112 Nausea with vomiting, unspecified: Secondary | ICD-10-CM

## 2015-11-01 DIAGNOSIS — O210 Mild hyperemesis gravidarum: Secondary | ICD-10-CM | POA: Insufficient documentation

## 2015-11-01 DIAGNOSIS — N390 Urinary tract infection, site not specified: Secondary | ICD-10-CM

## 2015-11-01 DIAGNOSIS — Z3A00 Weeks of gestation of pregnancy not specified: Secondary | ICD-10-CM | POA: Diagnosis not present

## 2015-11-01 DIAGNOSIS — Z3490 Encounter for supervision of normal pregnancy, unspecified, unspecified trimester: Secondary | ICD-10-CM

## 2015-11-01 LAB — COMPREHENSIVE METABOLIC PANEL
ALBUMIN: 4.3 g/dL (ref 3.5–5.0)
ALT: 8 U/L — AB (ref 14–54)
AST: 13 U/L — AB (ref 15–41)
Alkaline Phosphatase: 68 U/L (ref 38–126)
Anion gap: 11 (ref 5–15)
BUN: 8 mg/dL (ref 6–20)
CHLORIDE: 102 mmol/L (ref 101–111)
CO2: 23 mmol/L (ref 22–32)
CREATININE: 0.81 mg/dL (ref 0.44–1.00)
Calcium: 9.5 mg/dL (ref 8.9–10.3)
GFR calc Af Amer: >60 mL/min (ref >60)
Glucose, Bld: 123 mg/dL — ABNORMAL HIGH (ref 65–99)
POTASSIUM: 3.2 mmol/L — AB (ref 3.5–5.1)
SODIUM: 136 mmol/L (ref 135–145)
Total Bilirubin: 0.5 mg/dL (ref 0.3–1.2)
Total Protein: 8.5 g/dL — ABNORMAL HIGH (ref 6.5–8.1)

## 2015-11-01 LAB — URINALYSIS, ROUTINE W REFLEX MICROSCOPIC
GLUCOSE, UA: NEGATIVE mg/dL
HGB URINE DIPSTICK: NEGATIVE
Nitrite: NEGATIVE
PH: 7 (ref 5.0–8.0)
PROTEIN: 30 mg/dL — AB
Specific Gravity, Urine: 1.037 — ABNORMAL HIGH (ref 1.005–1.030)

## 2015-11-01 LAB — CBC
HCT: 38.7 % (ref 36.0–46.0)
Hemoglobin: 13.2 g/dL (ref 12.0–15.0)
MCH: 31.7 pg (ref 26.0–34.0)
MCHC: 34.1 g/dL (ref 30.0–36.0)
MCV: 93 fL (ref 78.0–100.0)
PLATELETS: 337 10*3/uL (ref 150–400)
RBC: 4.16 MIL/uL (ref 3.87–5.11)
RDW: 12.3 % (ref 11.5–15.5)
WBC: 13.1 10*3/uL — AB (ref 4.0–10.5)

## 2015-11-01 LAB — WET PREP, GENITAL
SPERM: NONE SEEN
Yeast Wet Prep HPF POC: NONE SEEN

## 2015-11-01 LAB — LIPASE, BLOOD: LIPASE: 18 U/L (ref 11–51)

## 2015-11-01 LAB — HIV ANTIBODY (ROUTINE TESTING W REFLEX): HIV Screen 4th Generation wRfx: NONREACTIVE

## 2015-11-01 LAB — GC/CHLAMYDIA PROBE AMP (~~LOC~~) NOT AT ARMC
CHLAMYDIA, DNA PROBE: NEGATIVE
Neisseria Gonorrhea: NEGATIVE

## 2015-11-01 LAB — URINE MICROSCOPIC-ADD ON: RBC / HPF: NONE SEEN RBC/hpf (ref 0–5)

## 2015-11-01 LAB — I-STAT BETA HCG BLOOD, ED (MC, WL, AP ONLY): I-stat hCG, quantitative: >2000 m[IU]/mL — ABNORMAL HIGH (ref <5)

## 2015-11-01 MED ORDER — CEFTRIAXONE SODIUM 1 G IJ SOLR
1.0000 g | Freq: Once | INTRAMUSCULAR | Status: AC
  Administered 2015-11-01: 1 g via INTRAMUSCULAR
  Filled 2015-11-01: qty 10

## 2015-11-01 MED ORDER — ONDANSETRON HCL 4 MG PO TABS: 4.0000 mg | ORAL_TABLET | Freq: Four times a day (QID) | ORAL | Status: DC

## 2015-11-01 MED ORDER — ONDANSETRON 8 MG PO TBDP
8.0000 mg | ORAL_TABLET | Freq: Once | ORAL | Status: AC
  Administered 2015-11-01: 8 mg via ORAL
  Filled 2015-11-01: qty 1

## 2015-11-01 MED ORDER — LIDOCAINE HCL 2 % IJ SOLN
INTRAMUSCULAR | Status: AC
  Administered 2015-11-01: 400 mg
  Filled 2015-11-01: qty 20

## 2015-11-01 MED ORDER — CEPHALEXIN 500 MG PO CAPS: 500.0000 mg | ORAL_CAPSULE | Freq: Four times a day (QID) | ORAL | Status: DC

## 2015-11-01 MED ORDER — AZITHROMYCIN 250 MG PO TABS
1000.0000 mg | ORAL_TABLET | Freq: Once | ORAL | Status: AC
  Administered 2015-11-01: 1000 mg via ORAL
  Filled 2015-11-01: qty 4

## 2015-11-01 NOTE — ED Notes (Signed)
Pelvic supplies at bedside. 

## 2015-11-01 NOTE — ED Provider Notes (Signed)
CSN: [id number]     Arrival date & time 11/01/15  0011 History   First MD Initiated Contact with Patient 11/01/15 0028     Chief Complaint  Patient presents with  . Emesis During Pregnancy  . Abdominal Pain     (Consider location/radiation/quality/duration/timing/severity/associated sxs/prior Treatment) HPI Comments: Patient with complaint of nausea and vomiting since becoming pregnant. Pain is limited to epigastric region and exacerbated by vomiting episodes. No lower abdominal pain, vaginal bleeding, vaginal discharge. No diarrhea or fever.   Patient is a 20 y.o. female presenting with abdominal pain. The history is provided by the patient. No language interpreter was used.  Abdominal Pain Pain location:  Epigastric Pain quality: cramping   Associated symptoms: nausea and vomiting   Associated symptoms: no cough, no diarrhea, no dysuria, no fever, no shortness of breath, no sore throat, no vaginal bleeding and no vaginal discharge     History reviewed. No pertinent past medical history. History reviewed. No pertinent past surgical history. Family History  Problem Relation Age of Onset  . Cancer Maternal Grandmother    Social History  Substance Use Topics  . Smoking status: Never Smoker   . Smokeless tobacco: None  . Alcohol Use: No   OB History    Gravida Para Term Preterm AB TAB SAB Ectopic Multiple Living   1              Review of Systems  Constitutional: Negative for fever.  HENT: Negative for congestion and sore throat.   Respiratory: Negative for cough and shortness of breath.   Gastrointestinal: Positive for nausea, vomiting and abdominal pain. Negative for diarrhea.  Genitourinary: Negative for dysuria, vaginal bleeding and vaginal discharge.  Musculoskeletal: Negative for back pain.      Allergies  Review of patient's allergies indicates no known allergies.  Home Medications   Prior to Admission medications   Medication Sig Start Date End Date Taking?  Authorizing Provider  etonogestrel-ethinyl estradiol (NUVARING) 0.12-0.015 MG/24HR vaginal ring Insert vaginally and leave in place for 3 consecutive weeks, then remove for 1 week. Patient not taking: Reported on 11/01/2015 09/24/15   Dorna Leitz, PA-C   BP 137/79 mmHg  Pulse 79  Temp(Src) 98.1 F (36.7 C) (Oral)  Resp 16  SpO2 100%  LMP 09/20/2015 (Exact Date) Physical Exam  Constitutional: She is oriented to person, place, and time. She appears well-developed and well-nourished. No distress.  Neck: Normal range of motion. Neck supple.  Cardiovascular: Normal rate.   No murmur heard. Pulmonary/Chest: Effort normal. She has no wheezes. She has no rales.  Abdominal: Soft. She exhibits no mass. There is tenderness (Epigastric tenderness. ).  No lower abdominal tenderness.   Neurological: She is alert and oriented to person, place, and time.  Skin: Skin is warm and dry.  Psychiatric: She has a normal mood and affect.    ED Course  Procedures (including critical care time) Labs Review Labs Reviewed  COMPREHENSIVE METABOLIC PANEL - Abnormal; Notable for the following:    Potassium 3.2 (*)    Glucose, Bld 123 (*)    Total Protein 8.5 (*)    AST 13 (*)    ALT 8 (*)    All other components within normal limits  CBC - Abnormal; Notable for the following:    WBC 13.1 (*)    All other components within normal limits  I-STAT BETA HCG BLOOD, ED ([facility], [facility], [facility] ONLY) - Abnormal; Notable for the following:    I-stat hCG,  quantitative >2000.0 (*)    All other components within normal limits  LIPASE, BLOOD  URINALYSIS, ROUTINE W REFLEX MICROSCOPIC (NOT AT [facility])   Results for orders placed or performed during the hospital encounter of 11/01/15  Wet prep, genital  Result Value Ref Range   Yeast Wet Prep HPF POC NONE SEEN NONE SEEN   Trich, Wet Prep PRESENT (A) NONE SEEN   Clue Cells Wet Prep HPF POC PRESENT (A) NONE SEEN   WBC, Wet Prep HPF POC MANY (A) NONE SEEN   Sperm NONE SEEN    Lipase, blood  Result Value Ref Range   Lipase 18 11 - 51 U/L  Comprehensive metabolic panel  Result Value Ref Range   Sodium 136 135 - 145 mmol/L   Potassium 3.2 (L) 3.5 - 5.1 mmol/L   Chloride 102 101 - 111 mmol/L   CO2 23 22 - 32 mmol/L   Glucose, Bld 123 (H) 65 - 99 mg/dL   BUN 8 6 - 20 mg/dL   Creatinine, Ser 4.130.81 0.44 - 1.00 mg/dL   Calcium 9.5 8.9 - 24.410.3 mg/dL   Total Protein 8.5 (H) 6.5 - 8.1 g/dL   Albumin 4.3 3.5 - 5.0 g/dL   AST 13 (L) 15 - 41 U/L   ALT 8 (L) 14 - 54 U/L   Alkaline Phosphatase 68 38 - 126 U/L   Total Bilirubin 0.5 0.3 - 1.2 mg/dL   GFR calc non Af Amer >60 >60 mL/min   GFR calc Af Amer >60 >60 mL/min   Anion gap 11 5 - 15  CBC  Result Value Ref Range   WBC 13.1 (H) 4.0 - 10.5 K/uL   RBC 4.16 3.87 - 5.11 MIL/uL   Hemoglobin 13.2 12.0 - 15.0 g/dL   HCT 01.038.7 27.236.0 - 53.646.0 %   MCV 93.0 78.0 - 100.0 fL   MCH 31.7 26.0 - 34.0 pg   MCHC 34.1 30.0 - 36.0 g/dL   RDW 64.412.3 03.411.5 - 74.215.5 %   Platelets 337 150 - 400 K/uL  Urinalysis, Routine w reflex microscopic (not at [facility])  Result Value Ref Range   Color, Urine AMBER (A) YELLOW   APPearance CLOUDY (A) CLEAR   Specific Gravity, Urine 1.037 (H) 1.005 - 1.030   pH 7.0 5.0 - 8.0   Glucose, UA NEGATIVE NEGATIVE mg/dL   Hgb urine dipstick NEGATIVE NEGATIVE   Bilirubin Urine SMALL (A) NEGATIVE   Ketones, ur >80 (A) NEGATIVE mg/dL   Protein, ur 30 (A) NEGATIVE mg/dL   Nitrite NEGATIVE NEGATIVE   Leukocytes, UA LARGE (A) NEGATIVE  Urine microscopic-add on  Result Value Ref Range   Squamous Epithelial / LPF 6-30 (A) NONE SEEN   WBC, UA TOO NUMEROUS TO COUNT 0 - 5 WBC/hpf   RBC / HPF NONE SEEN 0 - 5 RBC/hpf   Bacteria, UA FEW (A) NONE SEEN   Urine-Other MUCOUS PRESENT   I-Stat beta hCG blood, ED ([facility], [facility], [facility] only)  Result Value Ref Range   I-stat hCG, quantitative >2000.0 (H) <5 mIU/mL   Comment 3            Imaging Review No results found. I have personally reviewed and evaluated these images and lab  results as part of my medical decision-making.   EKG Interpretation None      MDM   Final diagnoses:  None    1. Pregnant 2. UTI 3. Nausea and vomiting  The patient presents with complaint of nausea and vomiting  in setting of early pregnancy. No vaginal symptoms of bleeding or discharge. She has evidence of UTI on lab studies, treated with IM Rocephin in the ED.  No tenderness on pelvic exam. No cervical discharge though there is a scant amount of discharge in vaginal vault. Azithromycin provided as well to cover for possible STD exposure. Doubt PID without CMT or pelvic tenderness.. Nausea is controlled with Zofran and she is tolerating PO fluids without difficulty. She can be discharged home with referral for OB follow up.     Elpidio Anis, PA-C 11/01/15 1610  Gilda Crease, MD 11/01/15 [phone]

## 2015-11-01 NOTE — ED Notes (Addendum)
Pt is pregnant and states that she has had upper abdominal pain and vomiting 4x/day since Monday. Alert and oriented.

## 2015-11-01 NOTE — ED Notes (Signed)
Pt given 250 ml of gingerale.  Pt slowly sipping drink.

## 2015-11-01 NOTE — Discharge Instructions (Signed)
Eating Plan for Pregnant Women °While you are pregnant, your body will require additional nutrition to help support your growing baby. It is recommended that you consume: °· 150 additional calories each day during your first trimester. °· 300 additional calories each day during your second trimester. °· 300 additional calories each day during your third trimester. °Eating a healthy, well-balanced diet is very important for your health and for your baby's health. You also have a higher need for some vitamins and minerals, such as folic acid, calcium, iron, and vitamin D. °WHAT DO I NEED TO KNOW ABOUT EATING DURING PREGNANCY? °· Do not try to lose weight or go on a diet during pregnancy. °· Choose healthy, nutritious foods. Choose ½ of a sandwich with a glass of milk instead of a candy bar or a high-calorie sugar-sweetened beverage. °· Limit your overall intake of foods that have "empty calories." These are foods that have little nutritional value, such as sweets, desserts, candies, sugar-sweetened beverages, and fried foods. °· Eat a variety of foods, especially fruits and vegetables. °· Take a prenatal vitamin to help meet the additional needs during pregnancy, specifically for folic acid, iron, calcium, and vitamin D. °· Remember to stay active. Ask your health care provider for exercise recommendations that are specific to you. °· Practice good food safety and cleanliness, such as washing your hands before you eat and after you prepare raw meat. This helps to prevent foodborne illnesses, such as listeriosis, that can be very dangerous for your baby. Ask your health care provider for more information about listeriosis. °WHAT DOES 150 EXTRA CALORIES LOOK LIKE? °Healthy options for an additional 150 calories each day could be any of the following: °· Plain low-fat yogurt (6-8 oz) with ½ cup of berries. °· 1 apple with 2 teaspoons of peanut butter. °· Cut-up vegetables with ¼ cup of hummus. °· Low-fat chocolate milk  (8 oz or 1 cup). °· 1 string cheese with 1 medium orange. °· ½ of a peanut butter and jelly sandwich on whole-wheat bread (1 tsp of peanut butter). °For 300 calories, you could eat two of those healthy options each day.  °WHAT IS A HEALTHY AMOUNT OF WEIGHT TO GAIN? °The recommended amount of weight for you to gain is based on your pre-pregnancy BMI. If your pre-pregnancy BMI was: °· Less than 18 (underweight), you should gain 28-40 lb. °· 18-24.9 (normal), you should gain 25-35 lb. °· 25-29.9 (overweight), you should gain 15-25 lb. °· Greater than 30 (obese), you should gain 11-20 lb. °WHAT IF I AM HAVING TWINS OR MULTIPLES? °Generally, pregnant women who will be having twins or multiples may need to increase their daily calories by 300-600 calories each day. The recommended range for total weight gain is 25-54 lb, depending on your pre-pregnancy BMI. Talk with your health care provider for specific guidance about additional nutritional needs, weight gain, and exercise during your pregnancy. °WHAT FOODS CAN I EAT? °Grains °Any grains. Try to choose whole grains, such as whole-wheat bread, oatmeal, or brown rice. °Vegetables °Any vegetables. Try to eat a variety of colors and types of vegetables to get a full range of vitamins and minerals. Remember to wash your vegetables well before eating. °Fruits °Any fruits. Try to eat a variety of colors and types of fruit to get a full range of vitamins and minerals. Remember to wash your fruits well before eating. °Meats and Other Protein Sources °Lean meats, including chicken, turkey, fish, and lean cuts of beef, veal, or pork.   Make sure that all meats are cooked to "well done." Tofu. Tempeh. Beans. Eggs. Peanut butter and other nut butters. Seafood, such as shrimp, crab, and lobster. If you choose fish, select types that are higher in omega-3 fatty acids, including salmon, herring, mussels, trout, sardines, and pollock. Make sure that all meats are cooked to food-safe  temperatures. Dairy Pasteurized milk and milk alternatives. Pasteurized yogurt and pasteurized cheese. Cottage cheese. Sour cream. Beverages Water. Juices that contain 100% fruit juice or vegetable juice. Caffeine-free teas and decaffeinated coffee. Drinks that contain caffeine are okay to drink, but it is better to avoid caffeine. Keep your total caffeine intake to less than 200 mg each day (12 oz of coffee, tea, or soda) or as directed by your health care provider. Condiments Any pasteurized condiments. Sweets and Desserts Any sweets and desserts. Fats and Oils Any fats and oils. The items listed above may not be a complete list of recommended foods or beverages. Contact your dietitian for more options. WHAT FOODS ARE NOT RECOMMENDED? Vegetables Unpasteurized (raw) vegetable juices. Fruits Unpasteurized (raw) fruit juices. Meats and Other Protein Sources Cured meats that have nitrates, such as bacon, salami, and hotdogs. Luncheon meats, bologna, or other deli meats (unless they are reheated until they are steaming hot). Refrigerated pate, meat spreads from a meat counter, smoked seafood that is found in the refrigerated section of a store. Raw fish, such as sushi or sashimi. High mercury content fish, such as tilefish, shark, swordfish, and king mackerel. Raw meats, such as tuna or beef tartare. Undercooked meats and poultry. Make sure that all meats are cooked to food-safe temperatures. Dairy Unpasteurized (raw) milk and any foods that have raw milk in them. Soft cheeses, such as feta, queso blanco, queso fresco, Brie, Camembert cheeses, blue-veined cheeses, and Panela cheese (unless it is made with pasteurized milk, which must be stated on the label). Beverages Alcohol. Sugar-sweetened beverages, such as sodas, teas, or energy drinks. Condiments Homemade fermented foods and drinks, such as pickles, sauerkraut, or kombucha drinks. (Store-bought pasteurized versions of these are  okay.) Other Salads that are made in the store, such as ham salad, chicken salad, egg salad, tuna salad, and seafood salad. The items listed above may not be a complete list of foods and beverages to avoid. Contact your dietitian for more information.   This information is not intended to replace advice given to you by your health care provider. Make sure you discuss any questions you have with your health care provider.   Document Released: 04/21/2014 Document Reviewed: 04/21/2014 Elsevier Interactive Patient Education 2016 Elsevier Inc. Urinary Tract Infection Urinary tract infections (UTIs) can develop anywhere along your urinary tract. Your urinary tract is your body's drainage system for removing wastes and extra water. Your urinary tract includes two kidneys, two ureters, a bladder, and a urethra. Your kidneys are a pair of bean-shaped organs. Each kidney is about the size of your fist. They are located below your ribs, one on each side of your spine. CAUSES Infections are caused by microbes, which are microscopic organisms, including fungi, viruses, and bacteria. These organisms are so small that they can only be seen through a microscope. Bacteria are the microbes that most commonly cause UTIs. SYMPTOMS  Symptoms of UTIs may vary by age and gender of the patient and by the location of the infection. Symptoms in young women typically include a frequent and intense urge to urinate and a painful, burning feeling in the bladder or urethra during urination. Older  women and men are more likely to be tired, shaky, and weak and have muscle aches and abdominal pain. A fever may mean the infection is in your kidneys. Other symptoms of a kidney infection include pain in your back or sides below the ribs, nausea, and vomiting. DIAGNOSIS To diagnose a UTI, your caregiver will ask you about your symptoms. Your caregiver will also ask you to provide a urine sample. The urine sample will be tested for  bacteria and white blood cells. White blood cells are made by your body to help fight infection. TREATMENT  Typically, UTIs can be treated with medication. Because most UTIs are caused by a bacterial infection, they usually can be treated with the use of antibiotics. The choice of antibiotic and length of treatment depend on your symptoms and the type of bacteria causing your infection. HOME CARE INSTRUCTIONS  If you were prescribed antibiotics, take them exactly as your caregiver instructs you. Finish the medication even if you feel better after you have only taken some of the medication.  Drink enough water and fluids to keep your urine clear or pale yellow.  Avoid caffeine, tea, and carbonated beverages. They tend to irritate your bladder.  Empty your bladder often. Avoid holding urine for long periods of time.  Empty your bladder before and after sexual intercourse.  After a bowel movement, women should cleanse from front to back. Use each tissue only once. SEEK MEDICAL CARE IF:   You have back pain.  You develop a fever.  Your symptoms do not begin to resolve within 3 days. SEEK IMMEDIATE MEDICAL CARE IF:   You have severe back pain or lower abdominal pain.  You develop chills.  You have nausea or vomiting.  You have continued burning or discomfort with urination. MAKE SURE YOU:   Understand these instructions.  Will watch your condition.  Will get help right away if you are not doing well or get worse.   This information is not intended to replace advice given to you by your health care provider. Make sure you discuss any questions you have with your health care provider.   Document Released: 04/16/2005 Document Revised: 03/28/2015 Document Reviewed: 08/15/2011 Elsevier Interactive Patient Education [employer]2016 Elsevier Inc.

## 2015-11-02 LAB — RPR, QUANT+TP ABS (REFLEX)
Rapid Plasma Reagin, Quant: 1:2 {titer} — ABNORMAL HIGH
T Pallidum Abs: NEGATIVE

## 2015-11-02 LAB — RPR: RPR Ser Ql: REACTIVE — AB

## 2016-03-12 ENCOUNTER — Ambulatory Visit: Payer: BLUE CROSS/BLUE SHIELD

## 2016-06-16 ENCOUNTER — Encounter: Payer: Self-pay | Admitting: Family Medicine

## 2016-06-16 ENCOUNTER — Ambulatory Visit (INDEPENDENT_AMBULATORY_CARE_PROVIDER_SITE_OTHER): Payer: BLUE CROSS/BLUE SHIELD

## 2016-06-16 DIAGNOSIS — Z3201 Encounter for pregnancy test, result positive: Secondary | ICD-10-CM | POA: Diagnosis not present

## 2016-06-16 LAB — POCT PREGNANCY, URINE: Preg Test, Ur: POSITIVE — AB

## 2016-06-16 NOTE — Progress Notes (Signed)
Patient presented to office today for pregnancy test. Test confirms she is pregnant at this time. Patient has been given a letter of verification. Medications have been review with patient. Patient will follow up with her ob gyn of choice.

## 2016-06-20 ENCOUNTER — Emergency Department (HOSPITAL_COMMUNITY): Payer: BLUE CROSS/BLUE SHIELD

## 2016-06-20 ENCOUNTER — Emergency Department (HOSPITAL_COMMUNITY)
Admission: EM | Admit: 2016-06-20 | Discharge: 2016-06-20 | Disposition: A | Discharge status: Home / Self Care | Payer: BLUE CROSS/BLUE SHIELD | Attending: Physician Assistant | Admitting: Physician Assistant

## 2016-06-20 ENCOUNTER — Encounter (HOSPITAL_COMMUNITY): Payer: Self-pay | Admitting: Emergency Medicine

## 2016-06-20 DIAGNOSIS — O209 Hemorrhage in early pregnancy, unspecified: Secondary | ICD-10-CM | POA: Insufficient documentation

## 2016-06-20 DIAGNOSIS — Z79899 Other long term (current) drug therapy: Secondary | ICD-10-CM | POA: Diagnosis not present

## 2016-06-20 DIAGNOSIS — Z3A01 Less than 8 weeks gestation of pregnancy: Secondary | ICD-10-CM | POA: Insufficient documentation

## 2016-06-20 DIAGNOSIS — O23591 Infection of other part of genital tract in pregnancy, first trimester: Secondary | ICD-10-CM | POA: Diagnosis not present

## 2016-06-20 DIAGNOSIS — O009 Unspecified ectopic pregnancy without intrauterine pregnancy: Secondary | ICD-10-CM

## 2016-06-20 DIAGNOSIS — R102 Pelvic and perineal pain: Secondary | ICD-10-CM | POA: Insufficient documentation

## 2016-06-20 DIAGNOSIS — B379 Candidiasis, unspecified: Secondary | ICD-10-CM

## 2016-06-20 DIAGNOSIS — N939 Abnormal uterine and vaginal bleeding, unspecified: Secondary | ICD-10-CM

## 2016-06-20 DIAGNOSIS — O26891 Other specified pregnancy related conditions, first trimester: Secondary | ICD-10-CM | POA: Diagnosis present

## 2016-06-20 LAB — ABO/RH: ABO/RH(D): A POS

## 2016-06-20 LAB — URINALYSIS, ROUTINE W REFLEX MICROSCOPIC
Bilirubin Urine: NEGATIVE
Glucose, UA: NEGATIVE mg/dL
Hgb urine dipstick: NEGATIVE
Ketones, ur: NEGATIVE mg/dL
NITRITE: NEGATIVE
Protein, ur: NEGATIVE mg/dL
SPECIFIC GRAVITY, URINE: 1.025 (ref 1.005–1.030)
pH: 7 (ref 5.0–8.0)

## 2016-06-20 LAB — URINE MICROSCOPIC-ADD ON

## 2016-06-20 LAB — WET PREP, GENITAL
Clue Cells Wet Prep HPF POC: NONE SEEN
TRICH WET PREP: NONE SEEN

## 2016-06-20 LAB — POC URINE PREG, ED: Preg Test, Ur: POSITIVE — AB

## 2016-06-20 LAB — HCG, QUANTITATIVE, PREGNANCY: hCG, Beta Chain, Quant, S: 8976 m[IU]/mL — ABNORMAL HIGH (ref <5)

## 2016-06-20 MED ORDER — MICONAZOLE NITRATE 1200 & 2 MG & % VA KIT: 1.0000 | PACK | Freq: Once | VAGINAL | 0 refills | Status: AC

## 2016-06-20 NOTE — ED Provider Notes (Signed)
[facility]-EMERGENCY DEPT Provider Note   CSN: [id number] Arrival date & time: 06/20/16  0944     History   Chief Complaint Chief Complaint  Patient presents with  . Groin Swelling    HPI Sabrina Coleman Hem is a 20 y.o. female.  HPI   Patient is a 20 year old female presenting with vaginal pain. Patient used boric acid in her vagina to help remedy the situation. She reports that she filled gelatin capsules with folic acid and then placed in her vagina. And she did this before she knew she was pregnant. Now she knows she is pregnant and she has been having burning and pain in her vagina.  No bleeding, mild cramping.  [facility].  History reviewed. No pertinent past medical history.  Patient Active Problem List   Diagnosis Date Noted  . BMI 35.0-35.9,adult 11/15/2013    History reviewed. No pertinent surgical history.  OB History    Gravida Para Term Preterm AB Living   1             SAB TAB Ectopic Multiple Live Births                   Home Medications    Prior to Admission medications   Medication Sig Start Date End Date Taking? Authorizing Provider  cephALEXin (KEFLEX) 500 MG capsule Take 1 capsule (500 mg total) by mouth 4 (four) times daily. Patient not taking: Reported on 06/16/2016 11/01/15   Elpidio AnisShari Upstill, PA-C  ondansetron (ZOFRAN) 4 MG tablet Take 1 tablet (4 mg total) by mouth every 6 (six) hours. Patient not taking: Reported on 06/16/2016 11/01/15   Elpidio AnisShari Upstill, PA-C    Family History Family History  Problem Relation Age of Onset  . Cancer Maternal Grandmother     Social History Social History  Substance Use Topics  . Smoking status: Never Smoker  . Smokeless tobacco: Never Used  . Alcohol use No     Allergies   Patient has no known allergies.   Review of Systems Review of Systems  Constitutional: Negative for fatigue and fever.  Respiratory: Negative for shortness of breath and  stridor.   Genitourinary: Negative for menstrual problem.     Physical Exam Updated Vital Signs BP 116/80   Pulse 85   Temp 97.8 F (36.6 C) (Oral)   Resp 15   Ht 5\' 7"  (1.702 m)   Wt 170 lb (77.1 kg)   LMP 04/18/2016   SpO2 100%   BMI 26.63 kg/m   Physical Exam  Constitutional: She is oriented to person, place, and time. She appears well-developed and well-nourished.  HENT:  Head: Normocephalic and atraumatic.  Eyes: Right eye exhibits no discharge.  Cardiovascular: Normal rate, regular rhythm and normal heart sounds.   No murmur heard. Pulmonary/Chest: Effort normal and breath sounds normal. She has no wheezes. She has no rales.  Abdominal: Soft. She exhibits no distension. There is no tenderness.  Genitourinary: Vagina normal.  Genitourinary Comments: Mild irritation, white chunky discharge.  Neurological: She is oriented to person, place, and time.  Skin: Skin is warm and dry. She is not diaphoretic.  Psychiatric: She has a normal mood and affect.  Nursing note and vitals reviewed.    ED Treatments / Results  Labs (all labs ordered are listed, but only abnormal results are displayed) Labs Reviewed  URINE CULTURE  WET PREP, GENITAL  HCG, QUANTITATIVE, PREGNANCY  HIV ANTIBODY (ROUTINE TESTING)  RPR  URINALYSIS, ROUTINE W REFLEX MICROSCOPIC (NOT [facility])  ABO/RH  GC/CHLAMYDIA PROBE AMP ([place]) NOT AT [facility]    EKG  EKG Interpretation None       Radiology No results found.  Procedures Procedures (including critical care time)  Medications Ordered in ED Medications - No data to display   Initial Impression / Assessment and Plan / ED Course  I have reviewed the triage vital signs and the nursing notes.  Pertinent labs & imaging results that were available during my care of the patient were reviewed by me and considered in my medical decision making (see chart for details).  Clinical Course     Patient is a very pleasant 20 year old female  presenting here with vaginal irritation after using for acid. Patient reports that she is used boric acid to try to solve her vaginal irritation and this made it worse. She reports irritation, chunky white discharge. Patient has new pregnancy. We will get ultrasound she's had some cramping. No bleeding.  Shows yeast on wet prep.  Will give monistat given risk of diflucan in early pregnancy.  Patient is comfortable, ambulatory, and taking PO at time of discharge.  Patient expressed understanding about return precautions.    Final Clinical Impressions(s) / ED Diagnoses   Final diagnoses:  Vaginal bleeding  Ectopic pregnancy    New Prescriptions New Prescriptions   No medications on file     Kiwana Deblasi Randall AnLyn Dorlis Judice, MD 06/20/16 1325

## 2016-06-20 NOTE — Discharge Instructions (Signed)
You were seen for vaginal itching. You had a yeast infection. We have given you a prescription for treatment. Please follow-up with your OB/GYN for your new pregnancy.

## 2016-06-20 NOTE — ED Triage Notes (Addendum)
Patient is having vagina pain x 2 days.  She states it is painful during sex.  Denies discharge or bleeding. Denies any trauma to area.   Patient is [redacted] weeks pregnant.  Patient states she put Boric Acid in a capsule and inserted into vagina 2 weeks ago, only 1 capsule. No burning after insertation. .Marland Kitchen

## 2016-06-21 LAB — RPR: RPR Ser Ql: NONREACTIVE

## 2016-06-21 LAB — HIV ANTIBODY (ROUTINE TESTING W REFLEX): HIV Screen 4th Generation wRfx: NONREACTIVE

## 2016-06-22 LAB — URINE CULTURE

## 2016-06-23 LAB — GC/CHLAMYDIA PROBE AMP (~~LOC~~) NOT AT ARMC
CHLAMYDIA, DNA PROBE: NEGATIVE
Neisseria Gonorrhea: NEGATIVE

## 2016-06-26 ENCOUNTER — Encounter (HOSPITAL_COMMUNITY): Payer: Self-pay | Admitting: *Deleted

## 2016-06-26 ENCOUNTER — Inpatient Hospital Stay (HOSPITAL_COMMUNITY)
Admission: AD | Admit: 2016-06-26 | Discharge: 2016-06-26 | Disposition: A | Discharge status: Home / Self Care | Payer: BLUE CROSS/BLUE SHIELD | Source: Ambulatory Visit | Attending: Obstetrics and Gynecology | Admitting: Obstetrics and Gynecology

## 2016-06-26 DIAGNOSIS — O9A311 Physical abuse complicating pregnancy, first trimester: Secondary | ICD-10-CM | POA: Diagnosis not present

## 2016-06-26 DIAGNOSIS — M542 Cervicalgia: Secondary | ICD-10-CM | POA: Diagnosis present

## 2016-06-26 DIAGNOSIS — Z3A09 9 weeks gestation of pregnancy: Secondary | ICD-10-CM | POA: Diagnosis not present

## 2016-06-26 DIAGNOSIS — O26899 Other specified pregnancy related conditions, unspecified trimester: Secondary | ICD-10-CM

## 2016-06-26 DIAGNOSIS — R109 Unspecified abdominal pain: Secondary | ICD-10-CM

## 2016-06-26 LAB — URINALYSIS, ROUTINE W REFLEX MICROSCOPIC
Bilirubin Urine: NEGATIVE
GLUCOSE, UA: NEGATIVE mg/dL
Hgb urine dipstick: NEGATIVE
KETONES UR: NEGATIVE mg/dL
LEUKOCYTES UA: NEGATIVE
NITRITE: NEGATIVE
PROTEIN: NEGATIVE mg/dL
Specific Gravity, Urine: 1.016 (ref 1.005–1.030)
pH: 7 (ref 5.0–8.0)

## 2016-06-26 LAB — WET PREP, GENITAL
CLUE CELLS WET PREP: NONE SEEN
Sperm: NONE SEEN
TRICH WET PREP: NONE SEEN
Yeast Wet Prep HPF POC: NONE SEEN

## 2016-06-26 MED ORDER — PRENATAL VITAMIN 27-0.8 MG PO TABS: 1.0000 | ORAL_TABLET | Freq: Every day | ORAL | 5 refills | Status: DC
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
## 2016-07-09 ENCOUNTER — Encounter: Payer: BLUE CROSS/BLUE SHIELD | Admitting: Certified Nurse Midwife

## 2016-08-04 ENCOUNTER — Ambulatory Visit (INDEPENDENT_AMBULATORY_CARE_PROVIDER_SITE_OTHER): Payer: Medicaid Other | Admitting: Certified Nurse Midwife

## 2016-08-04 ENCOUNTER — Encounter: Payer: Self-pay | Admitting: Certified Nurse Midwife

## 2016-08-04 ENCOUNTER — Other Ambulatory Visit (HOSPITAL_COMMUNITY)
Admission: RE | Admit: 2016-08-04 | Discharge: 2016-08-04 | Disposition: A | Discharge status: Home / Self Care | Payer: Medicaid Other | Source: Ambulatory Visit | Attending: Certified Nurse Midwife | Admitting: Certified Nurse Midwife

## 2016-08-04 VITALS — BP 129/69 | HR 64 | Temp 98.0°F | Wt <= 1120 oz

## 2016-08-04 DIAGNOSIS — Z3481 Encounter for supervision of other normal pregnancy, first trimester: Secondary | ICD-10-CM | POA: Diagnosis present

## 2016-08-04 DIAGNOSIS — Z3491 Encounter for supervision of normal pregnancy, unspecified, first trimester: Secondary | ICD-10-CM

## 2016-08-04 DIAGNOSIS — Z3A12 12 weeks gestation of pregnancy: Secondary | ICD-10-CM | POA: Diagnosis not present

## 2016-08-04 DIAGNOSIS — IMO0002 Reserved for concepts with insufficient information to code with codable children: Secondary | ICD-10-CM

## 2016-08-04 DIAGNOSIS — Z3490 Encounter for supervision of normal pregnancy, unspecified, unspecified trimester: Secondary | ICD-10-CM | POA: Insufficient documentation

## 2016-08-04 NOTE — Progress Notes (Signed)
Subjective:    Sabrina Coleman is being seen today for her first obstetrical visit.  This is a planned pregnancy. She is at [redacted]w[redacted]d gestation. Her obstetrical history is significant for none. Relationship with FOB: significant other, living together. Patient does intend to breast feed. Pregnancy history fully reviewed.  The information documented in the HPI was reviewed and verified.  Menstrual History: OB History    Gravida Para Term Preterm AB Living   2       1     SAB TAB Ectopic Multiple Live Births                   Patient's last menstrual period was 04/18/2016.    Past Medical History:  Diagnosis Date  . Medical history non-contributory     Past Surgical History:  Procedure Laterality Date  . WISDOM TOOTH EXTRACTION       (Not in a hospital admission) No Known Allergies  Social History  Substance Use Topics  . Smoking status: Never Smoker  . Smokeless tobacco: Never Used  . Alcohol use No    Family History  Problem Relation Age of Onset  . Cancer Maternal Grandmother      Review of Systems Constitutional: negative for weight loss Gastrointestinal: negative for vomiting Genitourinary:negative for genital lesions and vaginal discharge and dysuria Musculoskeletal:negative for back pain Behavioral/Psych: negative for abusive relationship, depression, illegal drug usage and tobacco use    Objective:    BP 129/69   Pulse 64   Temp 98 F (36.7 C)   Wt 17 lb 4.8 oz (7.847 kg)   LMP 04/18/2016   BMI 2.71 kg/m  General Appearance:    Alert, cooperative, no distress, appears stated age  Head:    Normocephalic, without obvious abnormality, atraumatic  Eyes:    PERRL, conjunctiva/corneas clear, EOM's intact, fundi    benign, both eyes  Ears:    Normal TM's and external ear canals, both ears  Nose:   Nares normal, septum midline, mucosa normal, no drainage    or sinus tenderness  Throat:   Lips, mucosa, and tongue normal; teeth and gums normal  Neck:   Supple,  symmetrical, trachea midline, no adenopathy;    thyroid:  no enlargement/tenderness/nodules; no carotid   bruit or JVD  Back:     Symmetric, no curvature, ROM normal, no CVA tenderness  Lungs:     Clear to auscultation bilaterally, respirations unlabored  Chest Wall:    No tenderness or deformity   Heart:    Regular rate and rhythm, S1 and S2 normal, no murmur, rub   or gallop  Breast Exam:    No tenderness, masses, or nipple abnormality  Abdomen:     Soft, non-tender, bowel sounds active all four quadrants,    no masses, no organomegaly  Genitalia:    Normal female without lesion, discharge or tenderness  Extremities:   Extremities normal, atraumatic, no cyanosis or edema  Pulses:   2+ and symmetric all extremities  Skin:   Skin color, texture, turgor normal, no rashes or lesions  Lymph nodes:   Cervical, supraclavicular, and axillary nodes normal  Neurologic:   CNII-XII intact, normal strength, sensation and reflexes    throughout       Cervix:  Long, thick, closed and anterior.  FHR seen with sonosite.  FH: c/w 12 dating.     Lab Review Urine pregnancy test Labs reviewed yes Radiologic studies reviewed yes Assessment:    Pregnancy at [redacted]w[redacted]d weeks  Plan:      Prenatal vitamins.  Counseling provided regarding continued use of seat belts, cessation of alcohol consumption, smoking or use of illicit drugs; infection precautions i.e., influenza/TDAP immunizations, toxoplasmosis,CMV, parvovirus, listeria and varicella; workplace safety, exercise during pregnancy; routine dental care, safe medications, sexual activity, hot tubs, saunas, pools, travel, caffeine use, fish and methlymercury, potential toxins, hair treatments, varicose veins Weight gain recommendations per IOM guidelines reviewed: underweight/BMI< 18.5--> gain 28 - 40 lbs; normal weight/BMI 18.5 - 24.9--> gain 25 - 35 lbs; overweight/BMI 25 - 29.9--> gain 15 - 25 lbs; obese/BMI >30->gain  11 - 20 lbs Problem list reviewed  and updated. FIRST/CF mutation testing/NIPT/QUAD SCREEN/fragile X/Ashkenazi Jewish population testing/Spinal muscular atrophy discussed: ordered. Role of ultrasound in pregnancy discussed; fetal survey: requested. Amniocentesis discussed: not indicated. VBAC calculator score: VBAC consent form provided No orders of the defined types were placed in this encounter.  Orders Placed This Encounter  Procedures  . Culture, OB Urine  . TSH  . Hemoglobinopathy evaluation  . Varicella zoster antibody, IgG  . MaterniT21 PLUS Core+SCA    Order Specific Question:   Is the patient insulin dependent?    Answer:   No    Order Specific Question:   Please enter gestational age. This should be expressed as weeks AND days, i.e. 16w 6d. Enter weeks here. Enter days in next question.    Answer:   5412    Order Specific Question:   Please enter gestational age. This should be expressed as weeks AND days, i.e. 16w 6d. Enter days here. Enter weeks in previous question.    Answer:   1    Order Specific Question:   How was gestational age calculated?    Answer:   Ultrasound    Order Specific Question:   Please give the date of LMP OR Ultrasound OR Estimated date of delivery.    Answer:   02/15/2017    Order Specific Question:   Number of Fetuses (Type of Pregnancy):    Answer:   1    Order Specific Question:   Indications for performing the test? (please choose all that apply):    Answer:   Routine screening    Order Specific Question:   Other Indications? (Y=Yes, N=No)    Answer:   N    Order Specific Question:   If this is a repeat specimen, please indicate the reason:    Answer:   Not indicated    Order Specific Question:   Please specify the patient's race: (C=White/Caucasion, B=Black, I=Native American, A=Asian, H=Hispanic, O=Other, U=Unknown)    Answer:   B    Order Specific Question:   Donor Egg - indicate if the egg was obtained from in vitro fertilization.    Answer:   N    Order Specific Question:    Age of Egg Donor.    Answer:   [birth year]20    Order Specific Question:   Prior Down Syndrome/ONTD screening during current pregnancy.    Answer:   N    Order Specific Question:   Prior First Trimester Testing    Answer:   N    Order Specific Question:   Prior Second Trimester Testing    Answer:   N    Order Specific Question:   Family History of Neural Tube Defects    Answer:   N    Order Specific Question:   Prior Pregnancy with Down Syndrome    Answer:   N  Order Specific Question:   Please give the patient's weight (in pounds)    Answer:   173  . ToxASSURE Select 13 (MW), Urine  . Hemoglobin A1c  . Obstetric Panel, Including HIV  . Cystic Fibrosis Mutation 97    Follow up in 4 weeks. 50% of 30 min visit spent on counseling and coordination of care.

## 2016-08-05 LAB — CERVICOVAGINAL ANCILLARY ONLY
BACTERIAL VAGINITIS: NEGATIVE
Candida vaginitis: NEGATIVE
Chlamydia: NEGATIVE
NEISSERIA GONORRHEA: NEGATIVE
Trichomonas: NEGATIVE

## 2016-08-06 LAB — CULTURE, OB URINE

## 2016-08-06 LAB — URINE CULTURE, OB REFLEX

## 2016-08-08 LAB — CYTOLOGY - PAP

## 2016-08-12 ENCOUNTER — Other Ambulatory Visit: Payer: Self-pay | Admitting: Certified Nurse Midwife

## 2016-08-12 DIAGNOSIS — R87619 Unspecified abnormal cytological findings in specimens from cervix uteri: Secondary | ICD-10-CM | POA: Insufficient documentation

## 2016-08-12 DIAGNOSIS — R87612 Low grade squamous intraepithelial lesion on cytologic smear of cervix (LGSIL): Secondary | ICD-10-CM

## 2016-08-14 ENCOUNTER — Other Ambulatory Visit: Payer: Self-pay | Admitting: Certified Nurse Midwife

## 2016-08-14 DIAGNOSIS — O09899 Supervision of other high risk pregnancies, unspecified trimester: Secondary | ICD-10-CM | POA: Insufficient documentation

## 2016-08-14 DIAGNOSIS — Z2839 Other underimmunization status: Secondary | ICD-10-CM

## 2016-08-14 DIAGNOSIS — Z283 Underimmunization status: Secondary | ICD-10-CM

## 2016-08-14 DIAGNOSIS — Z3480 Encounter for supervision of other normal pregnancy, unspecified trimester: Secondary | ICD-10-CM

## 2016-08-14 DIAGNOSIS — F1210 Cannabis abuse, uncomplicated: Secondary | ICD-10-CM | POA: Insufficient documentation

## 2016-08-14 LAB — MATERNIT21 PLUS CORE+SCA
Chromosome 13: NEGATIVE
Chromosome 18: NEGATIVE
Chromosome 21: NEGATIVE
PDF: 0
Y Chromosome: NOT DETECTED

## 2016-08-14 LAB — OBSTETRIC PANEL, INCLUDING HIV
ANTIBODY SCREEN: NEGATIVE
BASOS: 0 %
Basophils Absolute: 0 10*3/uL (ref 0.0–0.2)
EOS (ABSOLUTE): 0.1 10*3/uL (ref 0.0–0.4)
Eos: 1 %
HEMATOCRIT: 40 % (ref 34.0–46.6)
HIV SCREEN 4TH GENERATION: NONREACTIVE
Hemoglobin: 13.6 g/dL (ref 11.1–15.9)
Hepatitis B Surface Ag: NEGATIVE
Immature Grans (Abs): 0 10*3/uL (ref 0.0–0.1)
Immature Granulocytes: 0 %
LYMPHS: 24 %
Lymphocytes Absolute: 2.2 10*3/uL (ref 0.7–3.1)
MCH: 32.3 pg (ref 26.6–33.0)
MCHC: 34 g/dL (ref 31.5–35.7)
MCV: 95 fL (ref 79–97)
MONOS ABS: 0.5 10*3/uL (ref 0.1–0.9)
Monocytes: 6 %
NEUTROS ABS: 6.6 10*3/uL (ref 1.4–7.0)
Neutrophils: 69 %
PLATELETS: 290 10*3/uL (ref 150–379)
RBC: 4.21 x10E6/uL (ref 3.77–5.28)
RDW: 12.5 % (ref 12.3–15.4)
RPR Ser Ql: NONREACTIVE
Rh Factor: POSITIVE
Rubella Antibodies, IGG: 2.3 index (ref >0.99)
WBC: 9.5 10*3/uL (ref 3.4–10.8)

## 2016-08-14 LAB — HEMOGLOBINOPATHY EVALUATION
HEMOGLOBIN A2 QUANTITATION: 2.3 % (ref 1.8–3.2)
HGB C: 0 %
HGB S: 0 %
HGB VARIANT: 0 %
Hemoglobin F Quantitation: 0.6 % (ref 0.0–2.0)
Hgb A: 97.1 % (ref 96.4–98.8)

## 2016-08-14 LAB — CYSTIC FIBROSIS MUTATION 97: GENE DIS ANAL CARRIER INTERP BLD/T-IMP: NOT DETECTED

## 2016-08-14 LAB — HEMOGLOBIN A1C
Est. average glucose Bld gHb Est-mCnc: 77 mg/dL
HEMOGLOBIN A1C: 4.3 % — AB (ref 4.8–5.6)

## 2016-08-14 LAB — TSH: TSH: 2.01 u[IU]/mL (ref 0.450–4.500)

## 2016-08-14 LAB — VARICELLA ZOSTER ANTIBODY, IGG

## 2016-08-14 LAB — TOXASSURE SELECT 13 (MW), URINE

## 2016-09-01 ENCOUNTER — Ambulatory Visit (INDEPENDENT_AMBULATORY_CARE_PROVIDER_SITE_OTHER): Payer: Medicaid Other | Admitting: Certified Nurse Midwife

## 2016-09-01 VITALS — BP 103/69 | HR 83 | Wt 189.0 lb

## 2016-09-01 DIAGNOSIS — Z3480 Encounter for supervision of other normal pregnancy, unspecified trimester: Secondary | ICD-10-CM

## 2016-09-01 DIAGNOSIS — Z3482 Encounter for supervision of other normal pregnancy, second trimester: Secondary | ICD-10-CM
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
## 2016-09-06 LAB — AFP, SERUM, OPEN SPINA BIFIDA
AFP MOM: 1.24
AFP VALUE AFPOSL: 39.3 ng/mL
Gest. Age on Collection Date: 16.1 weeks
MATERNAL AGE AT EDD: 21.9 a
OSBR Risk 1 IN: 10000
Test Results:: NEGATIVE
WEIGHT: 189 [lb_av]

## 2016-09-08 ENCOUNTER — Other Ambulatory Visit: Payer: Self-pay | Admitting: Certified Nurse Midwife

## 2016-09-08 DIAGNOSIS — Z3480 Encounter for supervision of other normal pregnancy, unspecified trimester: Secondary | ICD-10-CM

## 2016-09-09 ENCOUNTER — Encounter: Payer: Self-pay | Admitting: Obstetrics

## 2016-09-09 ENCOUNTER — Ambulatory Visit (INDEPENDENT_AMBULATORY_CARE_PROVIDER_SITE_OTHER): Payer: Medicaid Other | Admitting: Obstetrics

## 2016-09-09 ENCOUNTER — Encounter (HOSPITAL_COMMUNITY): Payer: Self-pay | Admitting: Certified Nurse Midwife

## 2016-09-09 VITALS — BP 104/68 | HR 71 | Temp 99.0°F | Wt 190.0 lb

## 2016-09-09 DIAGNOSIS — Z3482 Encounter for supervision of other normal pregnancy, second trimester: Secondary | ICD-10-CM

## 2016-09-09 DIAGNOSIS — O219 Vomiting of pregnancy, unspecified: Secondary | ICD-10-CM

## 2016-09-09 DIAGNOSIS — Z3480 Encounter for supervision of other normal pregnancy, unspecified trimester: Secondary | ICD-10-CM

## 2016-09-09 LAB — POCT URINALYSIS DIPSTICK
BILIRUBIN UA: NEGATIVE
Glucose, UA: NEGATIVE
LEUKOCYTES UA: NEGATIVE
Nitrite, UA: NEGATIVE
PH UA: 6
RBC UA: NEGATIVE
SPEC GRAV UA: 1.015
Urobilinogen, UA: NEGATIVE

## 2016-09-09 MED ORDER — DOXYLAMINE-PYRIDOXINE 10-10 MG PO TBEC: DELAYED_RELEASE_TABLET | ORAL | 5 refills | Status: DC
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
## 2016-09-09 NOTE — Progress Notes (Signed)
Patient ID: Sabrina Coleman, female   DOB: 19-Dec-1995, 21 y.o.   MRN: [id number]

## 2016-09-19 ENCOUNTER — Ambulatory Visit (HOSPITAL_COMMUNITY)
Admission: RE | Admit: 2016-09-19 | Discharge: 2016-09-19 | Disposition: A | Discharge status: Home / Self Care | Payer: BLUE CROSS/BLUE SHIELD | Source: Ambulatory Visit | Attending: Certified Nurse Midwife | Admitting: Certified Nurse Midwife

## 2016-09-19 ENCOUNTER — Other Ambulatory Visit: Payer: Self-pay | Admitting: Certified Nurse Midwife

## 2016-09-19 DIAGNOSIS — Z3689 Encounter for other specified antenatal screening: Secondary | ICD-10-CM

## 2016-09-19 DIAGNOSIS — Z3A18 18 weeks gestation of pregnancy: Secondary | ICD-10-CM | POA: Insufficient documentation

## 2016-09-19 DIAGNOSIS — Z3480 Encounter for supervision of other normal pregnancy, unspecified trimester: Secondary | ICD-10-CM

## 2016-09-24 ENCOUNTER — Other Ambulatory Visit: Payer: Self-pay | Admitting: Certified Nurse Midwife

## 2016-09-24 DIAGNOSIS — Z3480 Encounter for supervision of other normal pregnancy, unspecified trimester: Secondary | ICD-10-CM

## 2016-09-25 ENCOUNTER — Encounter (HOSPITAL_COMMUNITY): Payer: Self-pay

## 2016-09-25 ENCOUNTER — Emergency Department (HOSPITAL_COMMUNITY)
Admission: EM | Admit: 2016-09-25 | Discharge: 2016-09-25 | Disposition: A | Discharge status: Home / Self Care | Payer: Medicaid Other | Attending: Emergency Medicine | Admitting: Emergency Medicine

## 2016-09-25 DIAGNOSIS — Z79899 Other long term (current) drug therapy: Secondary | ICD-10-CM | POA: Insufficient documentation

## 2016-09-25 DIAGNOSIS — O26892 Other specified pregnancy related conditions, second trimester: Secondary | ICD-10-CM | POA: Insufficient documentation

## 2016-09-25 DIAGNOSIS — R1084 Generalized abdominal pain: Secondary | ICD-10-CM | POA: Diagnosis not present

## 2016-09-25 DIAGNOSIS — Z3A19 19 weeks gestation of pregnancy: Secondary | ICD-10-CM | POA: Diagnosis not present

## 2016-09-25 LAB — URINALYSIS, ROUTINE W REFLEX MICROSCOPIC
BILIRUBIN URINE: NEGATIVE
GLUCOSE, UA: NEGATIVE mg/dL
HGB URINE DIPSTICK: NEGATIVE
Ketones, ur: NEGATIVE mg/dL
Nitrite: NEGATIVE
PH: 5 (ref 5.0–8.0)
Protein, ur: NEGATIVE mg/dL
SPECIFIC GRAVITY, URINE: 1.031 — AB (ref 1.005–1.030)

## 2016-09-25 LAB — COMPREHENSIVE METABOLIC PANEL
ALT: 8 U/L — ABNORMAL LOW (ref 14–54)
AST: 12 U/L — ABNORMAL LOW (ref 15–41)
Albumin: 3.3 g/dL — ABNORMAL LOW (ref 3.5–5.0)
Alkaline Phosphatase: 53 U/L (ref 38–126)
Anion gap: 5 (ref 5–15)
BILIRUBIN TOTAL: 0.4 mg/dL (ref 0.3–1.2)
BUN: 9 mg/dL (ref 6–20)
CHLORIDE: 107 mmol/L (ref 101–111)
CO2: 23 mmol/L (ref 22–32)
CREATININE: 0.62 mg/dL (ref 0.44–1.00)
Calcium: 9 mg/dL (ref 8.9–10.3)
Glucose, Bld: 77 mg/dL (ref 65–99)
POTASSIUM: 4.1 mmol/L (ref 3.5–5.1)
Sodium: 135 mmol/L (ref 135–145)
TOTAL PROTEIN: 6.9 g/dL (ref 6.5–8.1)

## 2016-09-25 LAB — CBC
HEMATOCRIT: 34.5 % — AB (ref 36.0–46.0)
Hemoglobin: 12 g/dL (ref 12.0–15.0)
MCH: 32.7 pg (ref 26.0–34.0)
MCHC: 34.8 g/dL (ref 30.0–36.0)
MCV: 94 fL (ref 78.0–100.0)
PLATELETS: 238 10*3/uL (ref 150–400)
RBC: 3.67 MIL/uL — ABNORMAL LOW (ref 3.87–5.11)
RDW: 12.5 % (ref 11.5–15.5)
WBC: 9.3 10*3/uL (ref 4.0–10.5)

## 2016-09-25 LAB — LIPASE, BLOOD: LIPASE: 15 U/L (ref 11–51)

## 2016-09-25 MED ORDER — RANITIDINE HCL 150 MG PO CAPS: 150.0000 mg | ORAL_CAPSULE | Freq: Every day | ORAL | 0 refills | Status: DC

## 2016-09-25 NOTE — ED Notes (Signed)
RN will collect labs at IV start 

## 2016-09-25 NOTE — ED Triage Notes (Signed)
Patient states she is 5 months pregnant. Patient reports that she begins to have lower abdominal pain after eating. Patient states the pain began yesterday. Patient states she has been prescribed an antiemetic, but has not picked the RX up yet.
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
## 2016-09-29 ENCOUNTER — Encounter: Payer: Medicaid Other | Admitting: Certified Nurse Midwife

## 2016-10-09 ENCOUNTER — Ambulatory Visit (INDEPENDENT_AMBULATORY_CARE_PROVIDER_SITE_OTHER): Payer: Medicaid Other | Admitting: Certified Nurse Midwife

## 2016-10-09 ENCOUNTER — Encounter: Payer: Self-pay | Admitting: *Deleted

## 2016-10-09 VITALS — BP 115/73 | HR 88 | Temp 98.1°F | Wt 189.6 lb

## 2016-10-09 DIAGNOSIS — Z3402 Encounter for supervision of normal first pregnancy, second trimester: Secondary | ICD-10-CM

## 2016-10-09 DIAGNOSIS — Z6835 Body mass index (BMI) 35.0-35.9, adult: Secondary | ICD-10-CM

## 2016-10-09 DIAGNOSIS — F1210 Cannabis abuse, uncomplicated: Secondary | ICD-10-CM

## 2016-10-09 DIAGNOSIS — Z3400 Encounter for supervision of normal first pregnancy, unspecified trimester: Secondary | ICD-10-CM

## 2016-10-09 DIAGNOSIS — R87612 Low grade squamous intraepithelial lesion on cytologic smear of cervix (LGSIL): Secondary | ICD-10-CM

## 2016-10-09 NOTE — Progress Notes (Signed)
Patient reports fetal flutters, denies pain/contractions.
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
## 2016-10-09 NOTE — Patient Instructions (Signed)
AREA PEDIATRIC/FAMILY PRACTICE PHYSICIANS  [facility] [address], [address] [place], [place]  [address] Phone - [phone]   Fax - [phone]  [facility] [address] [place], [place] [address] Phone - [phone]   Fax - [phone]  [facility] [address] [place], [place]  [address] Phone - [phone]   Fax - [phone]  [facility] [address] [place], [place]  [address] Phone - [phone]   Fax - [phone]  [facility] [address] [place], [place]  [address] Phone - [phone]   Fax - [phone]  [facility] [address], [address] [place], [place]  [address] Phone - [phone]   Fax - [phone]  [facility] [address], [address] [place], [place]  [address] Phone - [phone]   Fax - [phone]  [facility] [address], [address] [place], [place]  [address] Phone - [phone]   Fax - [phone]  [facility] [address] [place], [place]  [address] Phone - [phone]   Fax - [phone] [facility] [address] [place], [place]  [address] Phone - [phone]   Fax - [phone]  [facility] [address] [place], [place]  [address] Phone - [phone]   Fax - [phone]  [facility] [address] [place], [place]  [address] Phone - [phone]   Fax - [phone]  [facility] [address], [address] [place], [place]  [address] Phone - [phone]   Fax - [phone]  SHILPA GOSRANI [address] [place], [place]  [address] Phone - [phone]  [facility] [address] [place], [place]  [address] Phone - [phone]   Fax - [phone]  [facility] [address] [place], [place]  [address] Phone - [phone]   Fax - [phone]  [facility] [address] [place], [place]  [address] Phone - [phone]   Fax - [phone]  [facility] [address] [place], [place]  [address] Phone - [phone]   Fax - [phone]   [facility] [address] [place], [place]  [address] Phone - [phone]   Fax - [phone]  [facility] [address] [place], [place]  [address] Phone - [phone]   Fax - [phone]  DAVID RUBIN [address] [place], [place]  [address] Phone - [phone]   Fax - [phone]  [facility] [address] [place], [place]  [address] Phone - [phone]   Fax - [phone]  [facility] [address] [place], [place]  [address] Phone - [phone]   Fax - [phone] [facility] [address] [place], [place]  [address] Phone - [phone]   Fax - [phone]  [facility] [address] [place], [place]  [address] Phone - [phone]   Fax - [phone]  [facility] [address] [place], [place]  [address] Phone - [phone]   Fax - [phone]  [facility] Charlene Flemming MD [address] [place]  [address] Phone [phone]  Fax [phone]   

## 2016-10-21 ENCOUNTER — Encounter: Payer: Self-pay | Admitting: *Deleted

## 2016-11-03 ENCOUNTER — Inpatient Hospital Stay (HOSPITAL_COMMUNITY)
Admission: AD | Admit: 2016-11-03 | Discharge: 2016-11-03 | Disposition: A | Discharge status: Home / Self Care | Payer: Medicaid Other | Source: Ambulatory Visit | Attending: Obstetrics and Gynecology | Admitting: Obstetrics and Gynecology

## 2016-11-03 ENCOUNTER — Encounter (HOSPITAL_COMMUNITY): Payer: Self-pay

## 2016-11-03 DIAGNOSIS — J020 Streptococcal pharyngitis: Secondary | ICD-10-CM | POA: Diagnosis not present

## 2016-11-03 DIAGNOSIS — Z3A25 25 weeks gestation of pregnancy: Secondary | ICD-10-CM | POA: Diagnosis not present

## 2016-11-03 DIAGNOSIS — O99512 Diseases of the respiratory system complicating pregnancy, second trimester: Secondary | ICD-10-CM | POA: Insufficient documentation

## 2016-11-03 DIAGNOSIS — J029 Acute pharyngitis, unspecified: Secondary | ICD-10-CM | POA: Diagnosis present

## 2016-11-03 LAB — URINALYSIS, ROUTINE W REFLEX MICROSCOPIC
BILIRUBIN URINE: NEGATIVE
Glucose, UA: NEGATIVE mg/dL
HGB URINE DIPSTICK: NEGATIVE
KETONES UR: NEGATIVE mg/dL
NITRITE: NEGATIVE
PH: 5 (ref 5.0–8.0)
Protein, ur: NEGATIVE mg/dL
Specific Gravity, Urine: 1.025 (ref 1.005–1.030)

## 2016-11-03 MED ORDER — PENICILLIN V POTASSIUM 500 MG PO TABS: 500.0000 mg | ORAL_TABLET | Freq: Three times a day (TID) | ORAL | 0 refills | Status: DC

## 2016-11-03 NOTE — Discharge Instructions (Signed)
Strep Throat Strep throat is a bacterial infection of the throat. Your health care provider may call the infection tonsillitis or pharyngitis, depending on whether there is swelling in the tonsils or at the back of the throat. Strep throat is most common during the cold months of the year in children who are 5-22 years of age, but it can happen during any season in people of any age. This infection is spread from person to person (contagious) through coughing, sneezing, or close contact. What are the causes? Strep throat is caused by the bacteria called Streptococcus pyogenes. What increases the risk? This condition is more likely to develop in:  People who spend time in crowded places where the infection can spread easily.  People who have close contact with someone who has strep throat.  What are the signs or symptoms? Symptoms of this condition include:  Fever or chills.  Redness, swelling, or pain in the tonsils or throat.  Pain or difficulty when swallowing.  White or yellow spots on the tonsils or throat.  Swollen, tender glands in the neck or under the jaw.  Red rash all over the body (rare).  How is this diagnosed? This condition is diagnosed by performing a rapid strep test or by taking a swab of your throat (throat culture test). Results from a rapid strep test are usually ready in a few minutes, but throat culture test results are available after one or two days. How is this treated? This condition is treated with antibiotic medicine. Follow these instructions at home: Medicines  Take over-the-counter and prescription medicines only as told by your health care provider.  Take your antibiotic as told by your health care provider. Do not stop taking the antibiotic even if you start to feel better.  Have family members who also have a sore throat or fever tested for strep throat. They may need antibiotics if they have the strep infection. Eating and drinking  Do not  share food, drinking cups, or personal items that could cause the infection to spread to other people.  If swallowing is difficult, try eating soft foods until your sore throat feels better.  Drink enough fluid to keep your urine clear or pale yellow. General instructions  Gargle with a salt-water mixture 3-4 times per day or as needed. To make a salt-water mixture, completely dissolve -1 tsp of salt in 1 cup of warm water.  Make sure that all household members wash their hands well.  Get plenty of rest.  Stay home from school or work until you have been taking antibiotics for 24 hours.  Keep all follow-up visits as told by your health care provider. This is important. Contact a health care provider if:  The glands in your neck continue to get bigger.  You develop a rash, cough, or earache.  You cough up a thick liquid that is green, yellow-brown, or bloody.  You have pain or discomfort that does not get better with medicine.  Your problems seem to be getting worse rather than better.  You have a fever. Get help right away if:  You have new symptoms, such as vomiting, severe headache, stiff or painful neck, chest pain, or shortness of breath.  You have severe throat pain, drooling, or changes in your voice.  You have swelling of the neck, or the skin on the neck becomes red and tender.  You have signs of dehydration, such as fatigue, dry mouth, and decreased urination.  You become increasingly sleepy, or   you cannot wake up completely.  Your joints become red or painful. This information is not intended to replace advice given to you by your health care provider. Make sure you discuss any questions you have with your health care provider. Document Released: 07/04/2000 Document Revised: 03/05/2016 Document Reviewed: 10/30/2014 Elsevier Interactive Patient Education  2017 Elsevier Inc.  

## 2016-11-03 NOTE — MAU Provider Note (Signed)
History     CSN: [id number]  Arrival date and time: 11/03/16 2107   First Provider Initiated Contact with Patient 11/03/16 2132      Chief Complaint  Patient presents with  . Sore Throat   Sabrina Coleman is a 21 y.o. G2P0010 at G2P0010 who presents today with sore throat since yesterday. She rates her pain 8/10. She denies any contractions, vaginal bleeding or LOF. She has had some abdominal pain.    Sore Throat   This is a new problem. The current episode started yesterday. The problem has been unchanged. The pain is worse on the right side. There has been no fever. The pain is at a severity of 8/10. Associated symptoms include abdominal pain, congestion, coughing, ear pain, headaches and vomiting. She has had no exposure to strep or mono. She has tried nothing for the symptoms.    Past Medical History:  Diagnosis Date  . Medical history non-contributory     Past Surgical History:  Procedure Laterality Date  . INDUCED ABORTION    . WISDOM TOOTH EXTRACTION      Family History  Problem Relation Age of Onset  . Cancer Maternal Grandmother     Social History  Substance Use Topics  . Smoking status: Never Smoker  . Smokeless tobacco: Never Used  . Alcohol use No    Allergies: No Known Allergies  Prescriptions Prior to Admission  Medication Sig Dispense Refill Last Dose  . Doxylamine-Pyridoxine (DICLEGIS) 10-10 MG TBEC 1 tab in AM, 1 tab mid afternoon 2 tabs at bedtime. Max dose 4 tabs daily. (Patient not taking: Reported on 09/25/2016) 100 tablet 5 Not Taking  . Prenatal Vit-Fe Fumarate-FA (PRENATAL VITAMIN) 27-0.8 MG TABS Take 1 tablet by mouth daily. (Patient not taking: Reported on 09/25/2016) 90 tablet 5 Not Taking  . ranitidine (ZANTAC) 150 MG capsule Take 1 capsule (150 mg total) by mouth daily. (Patient not taking: Reported on 10/09/2016) 30 capsule 0 Not Taking    Review of Systems  Constitutional: Negative for chills and fever.  HENT: Positive for congestion and  ear pain.   Respiratory: Positive for cough.   Gastrointestinal: Positive for abdominal pain and vomiting.  Genitourinary: Negative for vaginal bleeding and vaginal discharge.  Neurological: Positive for headaches.   Physical Exam   Blood pressure 111/69, pulse (!) 104, temperature 98.9 F (37.2 C), temperature source Oral, resp. rate 18, last menstrual period 04/18/2016.  Physical Exam  Nursing note and vitals reviewed. Constitutional: She is oriented to person, place, and time. She appears well-developed. No distress.  HENT:  Head: Normocephalic.  Right Ear: No tenderness. Tympanic membrane is not erythematous and not bulging. No middle ear effusion.  Left Ear: No tenderness. Tympanic membrane is not erythematous and not bulging.  No middle ear effusion.  Mouth/Throat: Oropharyngeal exudate, posterior oropharyngeal edema and posterior oropharyngeal erythema present. No tonsillar abscesses.  Cardiovascular: Normal rate and normal heart sounds.   Respiratory: Effort normal and breath sounds normal.  GI: Soft. There is no tenderness. There is no rebound.  Neurological: She is alert and oriented to person, place, and time.  Skin: Skin is warm and dry.  Psychiatric: She has a normal mood and affect.   FHT: 150, moderate with 10x10 accels, no decels Toco: no UCs  MAU Course  Procedures  MDM   Assessment and Plan   1. Strep throat   2. [redacted] weeks gestation of pregnancy    DC home Comfort measures reviewed  2nd/3rd Trimester precautions  PTL precautions  Fetal kick counts RX: peni [id number]$  TID x 10 days  Return to MAU as needed FU with OB as planned  Follow-up Information    HARPER,CHARLES A, MD Follow up.   Specialty:  Obstetrics and Gynecology Contact information: [address] [place] [address] [phone]            [name] 11/03/2016, 9:35 PM

## 2016-11-03 NOTE — MAU Note (Signed)
Patient presents with c/o of a sore throat, headache, and N/V. Patient is also having right side abdominal pain that comes and goes for 2 weeks. Patient denies any bleeding or LOF.Fetus active.

## 2016-11-04 LAB — RAPID STREP SCREEN (MED CTR MEBANE ONLY): Streptococcus, Group A Screen (Direct): NEGATIVE

## 2016-11-06 ENCOUNTER — Ambulatory Visit (INDEPENDENT_AMBULATORY_CARE_PROVIDER_SITE_OTHER): Payer: BLUE CROSS/BLUE SHIELD | Admitting: Certified Nurse Midwife

## 2016-11-06 ENCOUNTER — Encounter: Payer: Self-pay | Admitting: *Deleted

## 2016-11-06 VITALS — BP 117/73 | HR 94 | Wt 201.0 lb

## 2016-11-06 DIAGNOSIS — Z283 Underimmunization status: Secondary | ICD-10-CM

## 2016-11-06 DIAGNOSIS — Z3482 Encounter for supervision of other normal pregnancy, second trimester: Secondary | ICD-10-CM

## 2016-11-06 DIAGNOSIS — Z6835 Body mass index (BMI) 35.0-35.9, adult: Secondary | ICD-10-CM

## 2016-11-06 DIAGNOSIS — O09899 Supervision of other high risk pregnancies, unspecified trimester: Secondary | ICD-10-CM

## 2016-11-06 DIAGNOSIS — Z3400 Encounter for supervision of normal first pregnancy, unspecified trimester: Secondary | ICD-10-CM

## 2016-11-06 DIAGNOSIS — H1033 Unspecified acute conjunctivitis, bilateral: Secondary | ICD-10-CM

## 2016-11-06 DIAGNOSIS — J069 Acute upper respiratory infection, unspecified: Secondary | ICD-10-CM

## 2016-11-06 DIAGNOSIS — R87612 Low grade squamous intraepithelial lesion on cytologic smear of cervix (LGSIL): Secondary | ICD-10-CM

## 2016-11-06 DIAGNOSIS — Z2839 Other underimmunization status: Secondary | ICD-10-CM

## 2016-11-06 LAB — CULTURE, GROUP A STREP (THRC)

## 2016-11-06 MED ORDER — AMOXICILLIN-POT CLAVULANATE 875-125 MG PO TABS: 1.0000 | ORAL_TABLET | Freq: Two times a day (BID) | ORAL | 0 refills | Status: DC

## 2016-11-06 MED ORDER — MOXIFLOXACIN HCL 0.5 % OP SOLN: 1.0000 [drp] | Freq: Three times a day (TID) | OPHTHALMIC | 0 refills | Status: DC

## 2016-11-06 MED ORDER — PRENATE PIXIE 10-0.6-0.4-200 MG PO CAPS: 1.0000 | ORAL_CAPSULE | Freq: Every day | ORAL | 12 refills | Status: DC
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
## 2016-11-06 NOTE — Progress Notes (Signed)
Patient said she only took one pill because she lost her Penicillin v Potassium pills.

## 2016-11-07 LAB — INFLUENZA A AND B
INFLUENZA A AG, EIA: NEGATIVE
Influenza B Ag, EIA: NEGATIVE

## 2016-11-07 LAB — PLEASE NOTE:

## 2016-11-17 ENCOUNTER — Ambulatory Visit (HOSPITAL_COMMUNITY)
Admission: RE | Admit: 2016-11-17 | Discharge: 2016-11-17 | Disposition: A | Discharge status: Home / Self Care | Payer: Medicaid Other | Source: Ambulatory Visit | Attending: Certified Nurse Midwife | Admitting: Certified Nurse Midwife

## 2016-11-17 ENCOUNTER — Other Ambulatory Visit: Payer: Self-pay | Admitting: Certified Nurse Midwife

## 2016-11-17 DIAGNOSIS — Z3A27 27 weeks gestation of pregnancy: Secondary | ICD-10-CM

## 2016-11-17 DIAGNOSIS — Z3400 Encounter for supervision of normal first pregnancy, unspecified trimester: Secondary | ICD-10-CM

## 2016-11-17 DIAGNOSIS — Z362 Encounter for other antenatal screening follow-up: Secondary | ICD-10-CM

## 2016-11-17 DIAGNOSIS — Z3402 Encounter for supervision of normal first pregnancy, second trimester: Secondary | ICD-10-CM | POA: Insufficient documentation

## 2016-11-20 ENCOUNTER — Other Ambulatory Visit: Payer: Self-pay | Admitting: Certified Nurse Midwife

## 2016-11-25 ENCOUNTER — Other Ambulatory Visit: Payer: Self-pay | Admitting: Certified Nurse Midwife

## 2016-11-26 ENCOUNTER — Encounter (HOSPITAL_COMMUNITY): Payer: Self-pay | Admitting: *Deleted

## 2016-11-26 ENCOUNTER — Inpatient Hospital Stay (HOSPITAL_COMMUNITY)
Admission: AD | Admit: 2016-11-26 | Discharge: 2016-12-03 | DRG: 765 | Disposition: A | Discharge status: Home / Self Care | Payer: BLUE CROSS/BLUE SHIELD | Source: Ambulatory Visit | Attending: Family Medicine | Admitting: Family Medicine

## 2016-11-26 ENCOUNTER — Inpatient Hospital Stay (HOSPITAL_COMMUNITY): Payer: BLUE CROSS/BLUE SHIELD

## 2016-11-26 DIAGNOSIS — O4593 Premature separation of placenta, unspecified, third trimester: Principal | ICD-10-CM | POA: Diagnosis present

## 2016-11-26 DIAGNOSIS — O99324 Drug use complicating childbirth: Secondary | ICD-10-CM | POA: Diagnosis present

## 2016-11-26 DIAGNOSIS — O4703 False labor before 37 completed weeks of gestation, third trimester: Secondary | ICD-10-CM

## 2016-11-26 DIAGNOSIS — Z6835 Body mass index (BMI) 35.0-35.9, adult: Secondary | ICD-10-CM | POA: Diagnosis not present

## 2016-11-26 DIAGNOSIS — O133 Gestational [pregnancy-induced] hypertension without significant proteinuria, third trimester: Secondary | ICD-10-CM | POA: Diagnosis not present

## 2016-11-26 DIAGNOSIS — O139 Gestational [pregnancy-induced] hypertension without significant proteinuria, unspecified trimester: Secondary | ICD-10-CM | POA: Diagnosis not present

## 2016-11-26 DIAGNOSIS — Z98891 History of uterine scar from previous surgery: Secondary | ICD-10-CM

## 2016-11-26 DIAGNOSIS — F1210 Cannabis abuse, uncomplicated: Secondary | ICD-10-CM | POA: Diagnosis present

## 2016-11-26 DIAGNOSIS — O134 Gestational [pregnancy-induced] hypertension without significant proteinuria, complicating childbirth: Secondary | ICD-10-CM | POA: Diagnosis present

## 2016-11-26 DIAGNOSIS — O99214 Obesity complicating childbirth: Secondary | ICD-10-CM | POA: Diagnosis present

## 2016-11-26 DIAGNOSIS — Z3A29 29 weeks gestation of pregnancy: Secondary | ICD-10-CM | POA: Diagnosis not present

## 2016-11-26 DIAGNOSIS — O458X3 Other premature separation of placenta, third trimester: Secondary | ICD-10-CM | POA: Diagnosis not present

## 2016-11-26 DIAGNOSIS — O468X3 Other antepartum hemorrhage, third trimester: Secondary | ICD-10-CM | POA: Diagnosis not present

## 2016-11-26 DIAGNOSIS — Z3A28 28 weeks gestation of pregnancy: Secondary | ICD-10-CM

## 2016-11-26 DIAGNOSIS — O4693 Antepartum hemorrhage, unspecified, third trimester: Secondary | ICD-10-CM | POA: Diagnosis present

## 2016-11-26 HISTORY — DX: Chlamydial infection, unspecified: A74.9

## 2016-11-26 HISTORY — DX: Unspecified infectious disease: B99.9

## 2016-11-26 LAB — URINALYSIS, ROUTINE W REFLEX MICROSCOPIC
Bilirubin Urine: NEGATIVE
GLUCOSE, UA: NEGATIVE mg/dL
Ketones, ur: NEGATIVE mg/dL
Leukocytes, UA: NEGATIVE
Nitrite: NEGATIVE
Protein, ur: NEGATIVE mg/dL
Specific Gravity, Urine: 1.019 (ref 1.005–1.030)
pH: 6 (ref 5.0–8.0)

## 2016-11-26 LAB — CBC
HEMATOCRIT: 37 % (ref 36.0–46.0)
HEMOGLOBIN: 12.7 g/dL (ref 12.0–15.0)
MCH: 32.6 pg (ref 26.0–34.0)
MCHC: 34.3 g/dL (ref 30.0–36.0)
MCV: 95.1 fL (ref 78.0–100.0)
Platelets: 223 10*3/uL (ref 150–400)
RBC: 3.89 MIL/uL (ref 3.87–5.11)
RDW: 13.3 % (ref 11.5–15.5)
WBC: 10.8 10*3/uL — AB (ref 4.0–10.5)

## 2016-11-26 LAB — ABO/RH: ABO/RH(D): A POS

## 2016-11-26 LAB — WET PREP, GENITAL
Sperm: NONE SEEN
TRICH WET PREP: NONE SEEN
YEAST WET PREP: NONE SEEN

## 2016-11-26 LAB — TYPE AND SCREEN
ABO/RH(D): A POS
ANTIBODY SCREEN: NEGATIVE

## 2016-11-26 MED ORDER — DOCUSATE SODIUM 100 MG PO CAPS
100.0000 mg | ORAL_CAPSULE | Freq: Every day | ORAL | Status: DC
  Administered 2016-11-27 – 2016-12-03 (×5): 100 mg via ORAL
  Filled 2016-11-26 (×6): qty 1

## 2016-11-26 MED ORDER — BETAMETHASONE SOD PHOS & ACET 6 (3-3) MG/ML IJ SUSP
12.0000 mg | Freq: Once | INTRAMUSCULAR | Status: AC
  Administered 2016-11-27: 12 mg via INTRAMUSCULAR
  Filled 2016-11-26: qty 2

## 2016-11-26 MED ORDER — BETAMETHASONE SOD PHOS & ACET 6 (3-3) MG/ML IJ SUSP
12.0000 mg | Freq: Once | INTRAMUSCULAR | Status: AC
  Administered 2016-11-26: 12 mg via INTRAMUSCULAR
  Filled 2016-11-26: qty 2

## 2016-11-26 MED ORDER — PRENATAL MULTIVITAMIN CH
1.0000 | ORAL_TABLET | Freq: Every day | ORAL | Status: DC
  Administered 2016-11-26 – 2016-11-29 (×4): 1 via ORAL
  Filled 2016-11-26 (×4): qty 1

## 2016-11-26 MED ORDER — CALCIUM CARBONATE ANTACID 500 MG PO CHEW: 2.0000 | CHEWABLE_TABLET | ORAL | Status: DC | PRN

## 2016-11-26 MED ORDER — LACTATED RINGERS IV SOLN
INTRAVENOUS | Status: DC
  Administered 2016-11-26: 12:00:00 via INTRAVENOUS

## 2016-11-26 MED ORDER — ZOLPIDEM TARTRATE 5 MG PO TABS: 5.0000 mg | ORAL_TABLET | Freq: Every evening | ORAL | Status: DC | PRN

## 2016-11-26 MED ORDER — ACETAMINOPHEN 325 MG PO TABS: 650.0000 mg | ORAL_TABLET | ORAL | Status: DC | PRN

## 2016-11-26 NOTE — MAU Note (Signed)
No small blood clot after urination, and blood when wiped this morning.  S.o. Noted brownish on his penis when he wiped, had had intercourse.  Never had bleeding like this before. Denies pain. No placental issues

## 2016-11-26 NOTE — MAU Provider Note (Signed)
History     CSN: [id number]  Arrival date and time: 11/26/16 1006   First Provider Initiated Contact with Patient 11/26/16 1104      Chief Complaint  Patient presents with  . Vaginal Bleeding   HPI   Sabrina Coleman is a 21 y.o. female G2P0010 @ [redacted]w[redacted]d here in MAU with vaginal bleeding.The bleeding started this morning, she says she had intercourse last night however did not noticed any bleeding after that. States she has never had bleeding with this preganncy.  Patient says she had an abnormal pap smear and needs a colpo after pregnancy. She has never been told she has a polyp or anything else on her cervix.   Denies vaginal bleeding or pain at this time.  + fetal movement.   OB History    Gravida Para Term Preterm AB Living   2       1     SAB TAB Ectopic Multiple Live Births     1            Past Medical History:  Diagnosis Date  . Chlamydia   . Infection    UTI  . Medical history non-contributory     Past Surgical History:  Procedure Laterality Date  . INDUCED ABORTION    . NO PAST SURGERIES    . WISDOM TOOTH EXTRACTION      Family History  Problem Relation Age of Onset  . Asthma Brother   . Cancer Maternal Grandmother   . Asthma Brother     Social History  Substance Use Topics  . Smoking status: Never Smoker  . Smokeless tobacco: Never Used  . Alcohol use No    Allergies: No Known Allergies  Prescriptions Prior to Admission  Medication Sig Dispense Refill Last Dose  . amoxicillin-clavulanate (AUGMENTIN) 875-125 MG tablet Take 1 tablet by mouth 2 (two) times daily. (Patient not taking: Reported on 11/26/2016) 14 tablet 0 Not Taking at Unknown time  . moxifloxacin (VIGAMOX) 0.5 % ophthalmic solution Place 1 drop into both eyes 3 (three) times daily. (Patient not taking: Reported on 11/26/2016) 3 mL 0 Not Taking at Unknown time  . Prenat-FeAsp-Meth-FA-DHA w/o A (PRENATE PIXIE) 10-0.6-0.4-200 MG CAPS Take 1 tablet by mouth daily. (Patient not taking:  Reported on 11/26/2016) 30 capsule 12 Not Taking at Unknown time  . ranitidine (ZANTAC) 150 MG capsule Take 1 capsule (150 mg total) by mouth daily. (Patient not taking: Reported on 10/09/2016) 30 capsule 0 Not Taking at Unknown time   Results for orders placed or performed during the hospital encounter of 11/26/16 (from the past 48 hour(s))  Urinalysis, Routine w reflex microscopic     Status: Abnormal   Collection Time: 11/26/16 10:16 AM  Result Value Ref Range   Color, Urine YELLOW YELLOW   APPearance CLEAR CLEAR   Specific Gravity, Urine 1.019 1.005 - 1.030   pH 6.0 5.0 - 8.0   Glucose, UA NEGATIVE NEGATIVE mg/dL   Hgb urine dipstick SMALL (A) NEGATIVE   Bilirubin Urine NEGATIVE NEGATIVE   Ketones, ur NEGATIVE NEGATIVE mg/dL   Protein, ur NEGATIVE NEGATIVE mg/dL   Nitrite NEGATIVE NEGATIVE   Leukocytes, UA NEGATIVE NEGATIVE   RBC / HPF 0-5 0 - 5 RBC/hpf   WBC, UA 6-30 0 - 5 WBC/hpf   Bacteria, UA RARE (A) NONE SEEN   Squamous Epithelial / LPF 0-5 (A) NONE SEEN   Mucous PRESENT   Wet prep, genital     Status: Abnormal   Collection  Time: 11/26/16 11:15 AM  Result Value Ref Range   Yeast Wet Prep HPF POC NONE SEEN NONE SEEN   Trich, Wet Prep NONE SEEN NONE SEEN   Clue Cells Wet Prep HPF POC PRESENT (A) NONE SEEN   WBC, Wet Prep HPF POC FEW (A) NONE SEEN    Comment: FEW BACTERIA SEEN   Sperm NONE SEEN    Review of Systems  Constitutional: Negative for fever.  Gastrointestinal: Negative for abdominal pain.  Genitourinary: Positive for vaginal bleeding. Negative for dysuria and pelvic pain.   Physical Exam   Blood pressure 127/81, pulse 76, temperature 98.3 F (36.8 C), temperature source Oral, resp. rate 18, weight 219 lb (99.3 kg), last menstrual period 04/18/2016, SpO2 100 %.  Physical Exam  Constitutional: She is oriented to person, place, and time. She appears well-developed and well-nourished. No distress.  HENT:  Head: Normocephalic.  Eyes: Pupils are equal, round,  and reactive to light.  Respiratory: Effort normal.  GI: Soft. She exhibits no distension and no mass. There is no tenderness. There is no rebound and no guarding.  Genitourinary:  Genitourinary Comments: Vagina - small-moderate amount of mucoid bloody discharge.  Cervix - ectropion, + active bleeding, ?bulging membranes, difficult to assess due to amount of bright red blood in the vault and the patient's intolerance of exam.  Bimanual exam: deferred  GC/Chlam, wet prep done Chaperone present for exam.   Musculoskeletal: Normal range of motion.  Neurological: She is alert and oriented to person, place, and time.  Skin: Skin is warm. She is not diaphoretic.  Psychiatric: Her behavior is normal.   MAU Course  Procedures  None  MDM  Discussed exam with Dr. Alvester MorinNewton, Category 1 fetal tracing, no contractions noted on fetal tracing. Patient is without pain. ?Large Polyp vs bulging membranes Previous US reviewed.  US ordered Betamethasone  Type and Screen  Assessment and Plan   A:  Vaginal bleeding in the third trimester of pregnancy Threatened preterm labor   P:  Admit to high risk OB Betamethasone Bed rest  Saifan Rayford, Harolyn RutherfordJennifer I, NP 11/26/2016 11:59 AM

## 2016-11-26 NOTE — MAU Note (Signed)
Urine in lab 

## 2016-11-27 ENCOUNTER — Encounter (HOSPITAL_COMMUNITY): Payer: Self-pay | Admitting: Obstetrics and Gynecology

## 2016-11-27 ENCOUNTER — Encounter: Payer: Medicaid Other | Admitting: Certified Nurse Midwife

## 2016-11-27 ENCOUNTER — Other Ambulatory Visit: Payer: Medicaid Other

## 2016-11-27 DIAGNOSIS — Z3A28 28 weeks gestation of pregnancy: Secondary | ICD-10-CM

## 2016-11-27 DIAGNOSIS — O468X3 Other antepartum hemorrhage, third trimester: Secondary | ICD-10-CM

## 2016-11-27 DIAGNOSIS — O133 Gestational [pregnancy-induced] hypertension without significant proteinuria, third trimester: Secondary | ICD-10-CM

## 2016-11-27 LAB — GC/CHLAMYDIA PROBE AMP (~~LOC~~) NOT AT ARMC
CHLAMYDIA, DNA PROBE: NEGATIVE
NEISSERIA GONORRHEA: NEGATIVE

## 2016-11-27 LAB — HIV ANTIBODY (ROUTINE TESTING W REFLEX): HIV SCREEN 4TH GENERATION: NONREACTIVE

## 2016-11-27 LAB — RPR: RPR Ser Ql: NONREACTIVE
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
## 2016-11-28 DIAGNOSIS — O4693 Antepartum hemorrhage, unspecified, third trimester: Secondary | ICD-10-CM

## 2016-11-28 LAB — COMPREHENSIVE METABOLIC PANEL
ALT: 11 U/L — ABNORMAL LOW (ref 14–54)
ANION GAP: 7 (ref 5–15)
AST: 15 U/L (ref 15–41)
Albumin: 2.9 g/dL — ABNORMAL LOW (ref 3.5–5.0)
Alkaline Phosphatase: 95 U/L (ref 38–126)
BILIRUBIN TOTAL: 0.4 mg/dL (ref 0.3–1.2)
BUN: 14 mg/dL (ref 6–20)
CHLORIDE: 105 mmol/L (ref 101–111)
CO2: 23 mmol/L (ref 22–32)
Calcium: 9 mg/dL (ref 8.9–10.3)
Creatinine, Ser: 0.75 mg/dL (ref 0.44–1.00)
GFR calc Af Amer: >60 mL/min (ref >60)
GFR calc non Af Amer: >60 mL/min (ref >60)
Glucose, Bld: 96 mg/dL (ref 65–99)
POTASSIUM: 4 mmol/L (ref 3.5–5.1)
Sodium: 135 mmol/L (ref 135–145)
TOTAL PROTEIN: 6.6 g/dL (ref 6.5–8.1)

## 2016-11-28 LAB — CBC
HEMATOCRIT: 36.9 % (ref 36.0–46.0)
Hemoglobin: 12.6 g/dL (ref 12.0–15.0)
MCH: 33 pg (ref 26.0–34.0)
MCHC: 34.1 g/dL (ref 30.0–36.0)
MCV: 96.6 fL (ref 78.0–100.0)
PLATELETS: 232 10*3/uL (ref 150–400)
RBC: 3.82 MIL/uL — ABNORMAL LOW (ref 3.87–5.11)
RDW: 13.5 % (ref 11.5–15.5)
WBC: 21.9 10*3/uL — AB (ref 4.0–10.5)

## 2016-11-28 LAB — PROTEIN / CREATININE RATIO, URINE
CREATININE, URINE: 45 mg/dL
PROTEIN CREATININE RATIO: 0.27 mg/mg{creat} — AB (ref 0.00–0.15)
Total Protein, Urine: 12 mg/dL

## 2016-11-28 NOTE — Progress Notes (Addendum)
Daily Antepartum Note  Admission Date: 11/26/2016 Current Date: 11/28/2016 7:11 AM  Sabrina Coleman is a 21 y.o. G2P0010 @ [redacted]w[redacted]d, HD#3, admitted for VB after intercourse  Pregnancy complicated by: THC BMI 30s h/o STDs h/o UTIs Overnight/24hr events:  none  Subjective:  Last bleed on 5/9, per patient. No decreased FM or PTL s/s. No s/s of pre-eclampsia  Objective:    Current Vital Signs 24h Vital Sign Ranges  T 98.1 F (36.7 C) Temp  Avg: 98.4 F (36.9 C)  Min: 98 F (36.7 C)  Max: 98.7 F (37.1 C)  BP 134/60 BP  Min: 103/82  Max: 163/65  HR (!) 58 Pulse  Avg: 78.7  Min: 58  Max: 94  RR 16 Resp  Avg: 17.2  Min: 16  Max: 18  SaO2 98 % Not Delivered SpO2  Avg: 99 %  Min: 98 %  Max: 100 %       24 Hour I/O Current Shift I/O  Time Ins Outs No intake/output data recorded. No intake/output data recorded.   Patient Vitals for the past 24 hrs:  BP Temp Temp src Pulse Resp SpO2  11/28/16 0439 134/60 98.1 F (36.7 C) Oral (!) 58 16 98 %  11/28/16 0006 (!) 150/80 98.2 F (36.8 C) Oral 71 16 100 %  11/27/16 2043 (!) 149/79 - - 89 - -  11/27/16 2021 (!) 163/65 98.6 F (37 C) Oral 72 18 99 %  11/27/16 1706 (!) 156/91 98.7 F (37.1 C) - 94 - -  11/27/16 1524 126/87 98.6 F (37 C) Oral 87 18 99 %  11/27/16 0800 103/82 98 F (36.7 C) Oral 80 18 99 %   5/10 NST: 140 baseline, +accels, no decels, mod var toco quiet Physical exam: General: Well nourished, well developed female in no acute distress. Abdomen: obese, nttp, gravid Cardiovascular: S1, S2 normal, no murmur, rub or gallop, regular rate and rhythm Respiratory: CTAB Extremities: no clubbing, cyanosis or edema Skin: Warm and dry.   Medications: Current Facility-Administered Medications  Medication Dose Route Frequency Provider Last Rate Last Dose  . acetaminophen (TYLENOL) tablet 650 mg  650 mg Oral Q4H PRN Rasch, Victorino Dike I, NP      . calcium carbonate (TUMS - dosed in mg elemental calcium) chewable tablet 400 mg of  elemental calcium  2 tablet Oral Q4H PRN Rasch, Victorino Dike I, NP      . docusate sodium (COLACE) capsule 100 mg  100 mg Oral Daily Rasch, Jennifer I, NP   100 mg at 11/27/16 1124  . prenatal multivitamin tablet 1 tablet  1 tablet Oral Q1200 Rasch, Victorino Dike I, NP   1 tablet at 11/27/16 1124  . zolpidem (AMBIEN) tablet 5 mg  5 mg Oral QHS PRN Rasch, Harolyn Rutherford, NP        Labs:   Recent Labs Lab 11/26/16 1123  WBC 10.8*  HGB 12.7  HCT 37.0  PLT 223   A POS   Negative: WP, GC/CT, hiv, rpr, u/a  Radiology: no new imagin  Assessment & Plan:  Pt doing well *Pregnancy: routine care. Pt will need 2hr after d/c to home *VB: rh pos. Keep 7 days from last bleed. Consider spec exam on d/c to see if there is a cx polyp -5/9: cephalic, cx 3.2cm, no previa, no e/o abruption *Preterm: s/p BMZ on 5/9 and 5/10. Consult NICU prn *?gHTN: I wasn't called about the elevated BPs and I don't see any before that while inpatient or during her pregnancy.  Pre-x labs orderd with her rpt t&s tomorrow, will do PC ratio and 24h urine collection and continue to follow BPs *PPx: SCDs, OOB ad lib *FEN/GI: regular diet *Dispo: keep 7d s/p last bleed  Cornelia Copaharlie Yuri Flener, Jr. MD Attending Center for [facility](Faculty Practice)

## 2016-11-29 ENCOUNTER — Encounter (HOSPITAL_COMMUNITY): Payer: Self-pay | Admitting: Family Medicine

## 2016-11-29 DIAGNOSIS — O139 Gestational [pregnancy-induced] hypertension without significant proteinuria, unspecified trimester: Secondary | ICD-10-CM | POA: Diagnosis not present

## 2016-11-29 DIAGNOSIS — O133 Gestational [pregnancy-induced] hypertension without significant proteinuria, third trimester: Secondary | ICD-10-CM

## 2016-11-29 LAB — TYPE AND SCREEN
ABO/RH(D): A POS
ANTIBODY SCREEN: NEGATIVE

## 2016-11-29 LAB — CBC
HEMATOCRIT: 34.1 % — AB (ref 36.0–46.0)
Hemoglobin: 11.8 g/dL — ABNORMAL LOW (ref 12.0–15.0)
MCH: 33.2 pg (ref 26.0–34.0)
MCHC: 34.6 g/dL (ref 30.0–36.0)
MCV: 96.1 fL (ref 78.0–100.0)
PLATELETS: 204 10*3/uL (ref 150–400)
RBC: 3.55 MIL/uL — ABNORMAL LOW (ref 3.87–5.11)
RDW: 13.5 % (ref 11.5–15.5)
WBC: 16.3 10*3/uL — AB (ref 4.0–10.5)

## 2016-11-29 LAB — COMPREHENSIVE METABOLIC PANEL
ALK PHOS: 105 U/L (ref 38–126)
ALT: 11 U/L — ABNORMAL LOW (ref 14–54)
AST: 14 U/L — AB (ref 15–41)
Albumin: 2.6 g/dL — ABNORMAL LOW (ref 3.5–5.0)
Anion gap: 6 (ref 5–15)
BUN: 16 mg/dL (ref 6–20)
CALCIUM: 8.4 mg/dL — AB (ref 8.9–10.3)
CO2: 22 mmol/L (ref 22–32)
CREATININE: 0.7 mg/dL (ref 0.44–1.00)
Chloride: 109 mmol/L (ref 101–111)
GFR calc Af Amer: >60 mL/min (ref >60)
GLUCOSE: 93 mg/dL (ref 65–99)
POTASSIUM: 3.9 mmol/L (ref 3.5–5.1)
Sodium: 137 mmol/L (ref 135–145)
TOTAL PROTEIN: 5.7 g/dL — AB (ref 6.5–8.1)
Total Bilirubin: 0.6 mg/dL (ref 0.3–1.2)

## 2016-11-29 MED ORDER — LABETALOL HCL 200 MG PO TABS
200.0000 mg | ORAL_TABLET | Freq: Two times a day (BID) | ORAL | Status: DC
  Administered 2016-11-29 – 2016-12-02 (×7): 200 mg via ORAL
  Filled 2016-11-29 (×4): qty 1
  Filled 2016-11-29: qty 2
  Filled 2016-11-29: qty 1
  Filled 2016-11-29: qty 2

## 2016-11-29 NOTE — Progress Notes (Signed)
Pt 24 hr urine collection started @0500  hrs. Pt was informed, pt verbalized understanding. Measuring hat in toilet and collection container placed on ice in bathroom.

## 2016-11-29 NOTE — Progress Notes (Addendum)
Patient ID: Sabrina Coleman, female   DOB: December 25, 1995, 21 y.o.   MRN: [id number] FACULTY PRACTICE ANTEPARTUM(COMPREHENSIVE) NOTE  Sabrina Coleman is a 21 y.o. G2P0010 at 3251w6d by best clinical estimate who is admitted for vaginal bleeding in the third trimester.   Fetal presentation is cephalic. Length of Stay:  3  Days  Subjective: Patient denies further bleeding. Finishing 24 hour urine. CBGs WNL Patient reports the fetal movement as active. Patient reports uterine contraction  activity as none. Patient reports  vaginal bleeding as none. Patient describes fluid per vagina as None.  Vitals:  Blood pressure (!) 155/82, pulse (!) 58, temperature 98.7 F (37.1 C), temperature source Oral, resp. rate 16, height 5\' 6"  (1.676 m), weight 219 lb (99.3 kg), last menstrual period 04/18/2016, SpO2 100 %. Physical Examination:  General appearance - alert, well appearing, and in no distress Chest - normal effort Heart - S1 and S2 normal Abdomen - gravid, NT Fundal Height:  size equals dates Extremities: Homans sign is negative, no sign of DVT  Membranes:intact  Fetal Monitoring:  Baseline: 150 bpm, Variability: Good {> 6 bpm), Accelerations: Non-reactive but appropriate for gestational age and Decelerations: Absent x2  Labs:  Results for orders placed or performed during the hospital encounter of 11/26/16 (from the past 24 hour(s))  CBC   Collection Time: 11/28/16  8:26 AM  Result Value Ref Range   WBC 21.9 (H) 4.0 - 10.5 K/uL   RBC 3.82 (L) 3.87 - 5.11 MIL/uL   Hemoglobin 12.6 12.0 - 15.0 g/dL   HCT 40.936.9 81.136.0 - 91.446.0 %   MCV 96.6 78.0 - 100.0 fL   MCH 33.0 26.0 - 34.0 pg   MCHC 34.1 30.0 - 36.0 g/dL   RDW 78.213.5 95.611.5 - 21.315.5 %   Platelets 232 150 - 400 K/uL  Comprehensive metabolic panel   Collection Time: 11/28/16  8:26 AM  Result Value Ref Range   Sodium 135 135 - 145 mmol/L   Potassium 4.0 3.5 - 5.1 mmol/L   Chloride 105 101 - 111 mmol/L   CO2 23 22 - 32 mmol/L   Glucose, Bld 96 65 -  99 mg/dL   BUN 14 6 - 20 mg/dL   Creatinine, Ser 0.860.75 0.44 - 1.00 mg/dL   Calcium 9.0 8.9 - 57.810.3 mg/dL   Total Protein 6.6 6.5 - 8.1 g/dL   Albumin 2.9 (L) 3.5 - 5.0 g/dL   AST 15 15 - 41 U/L   ALT 11 (L) 14 - 54 U/L   Alkaline Phosphatase 95 38 - 126 U/L   Total Bilirubin 0.4 0.3 - 1.2 mg/dL   GFR calc non Af Amer >60 >60 mL/min   GFR calc Af Amer >60 >60 mL/min   Anion gap 7 5 - 15  Protein / creatinine ratio, urine   Collection Time: 11/28/16 10:00 AM  Result Value Ref Range   Creatinine, Urine 45.00 mg/dL   Total Protein, Urine 12 mg/dL   Protein Creatinine Ratio 0.27 (H) 0.00 - 0.15 mg/mg[Cre]  Comprehensive metabolic panel   Collection Time: 11/29/16  5:23 AM  Result Value Ref Range   Sodium 137 135 - 145 mmol/L   Potassium 3.9 3.5 - 5.1 mmol/L   Chloride 109 101 - 111 mmol/L   CO2 22 22 - 32 mmol/L   Glucose, Bld 93 65 - 99 mg/dL   BUN 16 6 - 20 mg/dL   Creatinine, Ser 4.690.70 0.44 - 1.00 mg/dL   Calcium 8.4 (L) 8.9 - 10.3  mg/dL   Total Protein 5.7 (L) 6.5 - 8.1 g/dL   Albumin 2.6 (L) 3.5 - 5.0 g/dL   AST 14 (L) 15 - 41 U/L   ALT 11 (L) 14 - 54 U/L   Alkaline Phosphatase 105 38 - 126 U/L   Total Bilirubin 0.6 0.3 - 1.2 mg/dL   GFR calc non Af Amer >60 >60 mL/min   GFR calc Af Amer >60 >60 mL/min   Anion gap 6 5 - 15  CBC   Collection Time: 11/29/16  5:23 AM  Result Value Ref Range   WBC 16.3 (H) 4.0 - 10.5 K/uL   RBC 3.55 (L) 3.87 - 5.11 MIL/uL   Hemoglobin 11.8 (L) 12.0 - 15.0 g/dL   HCT 16.1 (L) 09.6 - 04.5 %   MCV 96.1 78.0 - 100.0 fL   MCH 33.2 26.0 - 34.0 pg   MCHC 34.6 30.0 - 36.0 g/dL   RDW 40.9 81.1 - 91.4 %   Platelets 204 150 - 400 K/uL    Medications:  Scheduled . docusate sodium  100 mg Oral Daily  . labetalol  200 mg Oral BID  . prenatal multivitamin  1 tablet Oral Q1200   I have reviewed the patient's current medications.  ASSESSMENT: Active Problems:   Vaginal bleeding in pregnancy, third trimester   Gestational  hypertension   PLAN: 24 hour urine is pending normal growth BP is elevated-->will begin Labetalol Wednesday will be 1 wk without bleeding  Reva Bores, MD 11/29/2016,8:03 AM

## 2016-11-30 ENCOUNTER — Inpatient Hospital Stay (HOSPITAL_COMMUNITY): Payer: BLUE CROSS/BLUE SHIELD | Admitting: Anesthesiology

## 2016-11-30 ENCOUNTER — Encounter (HOSPITAL_COMMUNITY)
Admission: AD | Disposition: A | Discharge status: Home / Self Care | Payer: Self-pay | Source: Ambulatory Visit | Attending: Family Medicine

## 2016-11-30 ENCOUNTER — Encounter (HOSPITAL_COMMUNITY): Payer: Self-pay

## 2016-11-30 DIAGNOSIS — Z3A29 29 weeks gestation of pregnancy: Secondary | ICD-10-CM

## 2016-11-30 DIAGNOSIS — O458X3 Other premature separation of placenta, third trimester: Secondary | ICD-10-CM

## 2016-11-30 LAB — PROTEIN, URINE, 24 HOUR
Collection Interval-UPROT: 24 hours
Protein, 24H Urine: 398 mg/d — ABNORMAL HIGH (ref 50–100)
URINE TOTAL VOLUME-UPROT: 1075 mL

## 2016-11-30 LAB — CREATININE, URINE, 24 HOUR
Collection Interval-UCRE24: 24 hours
Creatinine, 24H Ur: 1812 mg/d — ABNORMAL HIGH (ref 600–1800)
Creatinine, Urine: 168.55 mg/dL
URINE TOTAL VOLUME-UCRE24: 1075 mL

## 2016-11-30 SURGERY — Surgical Case

## 2016-11-30 MED ORDER — HYDROMORPHONE HCL 1 MG/ML IJ SOLN
INTRAMUSCULAR | Status: AC
  Filled 2016-11-30: qty 1

## 2016-11-30 MED ORDER — ZOLPIDEM TARTRATE 5 MG PO TABS: 5.0000 mg | ORAL_TABLET | Freq: Every evening | ORAL | Status: DC | PRN

## 2016-11-30 MED ORDER — ONDANSETRON HCL 4 MG/2ML IJ SOLN
INTRAMUSCULAR | Status: AC
  Filled 2016-11-30: qty 2

## 2016-11-30 MED ORDER — DIPHENHYDRAMINE HCL 25 MG PO CAPS: 25.0000 mg | ORAL_CAPSULE | Freq: Four times a day (QID) | ORAL | Status: DC | PRN

## 2016-11-30 MED ORDER — SUCCINYLCHOLINE CHLORIDE 200 MG/10ML IV SOSY
PREFILLED_SYRINGE | INTRAVENOUS | Status: AC
  Filled 2016-11-30: qty 10

## 2016-11-30 MED ORDER — SIMETHICONE 80 MG PO CHEW
80.0000 mg | CHEWABLE_TABLET | ORAL | Status: DC | PRN
  Administered 2016-12-02: 80 mg via ORAL

## 2016-11-30 MED ORDER — IBUPROFEN 600 MG PO TABS
600.0000 mg | ORAL_TABLET | Freq: Four times a day (QID) | ORAL | Status: DC
  Administered 2016-11-30 – 2016-12-03 (×12): 600 mg via ORAL
  Filled 2016-11-30 (×12): qty 1

## 2016-11-30 MED ORDER — DEXAMETHASONE SODIUM PHOSPHATE 10 MG/ML IJ SOLN
INTRAMUSCULAR | Status: AC
  Filled 2016-11-30: qty 1

## 2016-11-30 MED ORDER — FENTANYL CITRATE (PF) 100 MCG/2ML IJ SOLN
INTRAMUSCULAR | Status: AC
  Filled 2016-11-30: qty 2

## 2016-11-30 MED ORDER — LACTATED RINGERS IV SOLN
INTRAVENOUS | Status: DC
  Administered 2016-11-30: 13:00:00 via INTRAVENOUS

## 2016-11-30 MED ORDER — FENTANYL CITRATE (PF) 250 MCG/5ML IJ SOLN
INTRAMUSCULAR | Status: AC
  Filled 2016-11-30: qty 5

## 2016-11-30 MED ORDER — BUPIVACAINE HCL (PF) 0.25 % IJ SOLN
INTRAMUSCULAR | Status: AC
  Filled 2016-11-30: qty 20

## 2016-11-30 MED ORDER — LABETALOL HCL 5 MG/ML IV SOLN
5.0000 mg | INTRAVENOUS | Status: AC | PRN
  Administered 2016-11-30 (×4): 5 mg via INTRAVENOUS

## 2016-11-30 MED ORDER — PROPOFOL 10 MG/ML IV BOLUS
INTRAVENOUS | Status: DC | PRN
  Administered 2016-11-30: 200 mg via INTRAVENOUS

## 2016-11-30 MED ORDER — PROMETHAZINE HCL 25 MG/ML IJ SOLN: 6.2500 mg | INTRAMUSCULAR | Status: DC | PRN

## 2016-11-30 MED ORDER — SENNOSIDES-DOCUSATE SODIUM 8.6-50 MG PO TABS
2.0000 | ORAL_TABLET | ORAL | Status: DC
  Administered 2016-11-30 – 2016-12-02 (×3): 2 via ORAL
  Filled 2016-11-30 (×3): qty 2

## 2016-11-30 MED ORDER — OXYTOCIN 40 UNITS IN LACTATED RINGERS INFUSION - SIMPLE MED
INTRAVENOUS | Status: DC | PRN
  Administered 2016-11-30: 40 mL via INTRAVENOUS

## 2016-11-30 MED ORDER — OXYTOCIN 40 UNITS IN LACTATED RINGERS INFUSION - SIMPLE MED: 2.5000 [IU]/h | INTRAVENOUS | Status: AC

## 2016-11-30 MED ORDER — SOD CITRATE-CITRIC ACID 500-334 MG/5ML PO SOLN
ORAL | Status: AC
  Filled 2016-11-30: qty 15

## 2016-11-30 MED ORDER — CEFAZOLIN SODIUM-DEXTROSE 2-4 GM/100ML-% IV SOLN
INTRAVENOUS | Status: AC
Start: 2016-11-30 — End: 2016-11-30
  Filled 2016-11-30: qty 100

## 2016-11-30 MED ORDER — LACTATED RINGERS IV SOLN
INTRAVENOUS | Status: DC | PRN
  Administered 2016-11-30 (×2): via INTRAVENOUS

## 2016-11-30 MED ORDER — HYDROMORPHONE HCL 1 MG/ML IJ SOLN: 1.0000 mg | INTRAMUSCULAR | Status: DC | PRN

## 2016-11-30 MED ORDER — BUPIVACAINE HCL (PF) 0.25 % IJ SOLN
INTRAMUSCULAR | Status: AC
  Filled 2016-11-30: qty 10

## 2016-11-30 MED ORDER — DEXAMETHASONE SODIUM PHOSPHATE 10 MG/ML IJ SOLN
INTRAMUSCULAR | Status: DC | PRN
  Administered 2016-11-30: 10 mg via INTRAVENOUS

## 2016-11-30 MED ORDER — FENTANYL CITRATE (PF) 100 MCG/2ML IJ SOLN
INTRAMUSCULAR | Status: DC | PRN
  Administered 2016-11-30: 150 ug via INTRAVENOUS
  Administered 2016-11-30 (×2): 100 ug via INTRAVENOUS

## 2016-11-30 MED ORDER — OXYCODONE-ACETAMINOPHEN 5-325 MG PO TABS
1.0000 | ORAL_TABLET | ORAL | Status: DC | PRN
  Administered 2016-12-01 – 2016-12-03 (×4): 2 via ORAL
  Filled 2016-11-30 (×4): qty 2

## 2016-11-30 MED ORDER — ONDANSETRON HCL 4 MG/2ML IJ SOLN
INTRAMUSCULAR | Status: DC | PRN
  Administered 2016-11-30: 4 mg via INTRAVENOUS

## 2016-11-30 MED ORDER — LACTATED RINGERS IV SOLN
INTRAVENOUS | Status: DC | PRN
  Administered 2016-11-30: 09:00:00 via INTRAVENOUS

## 2016-11-30 MED ORDER — WITCH HAZEL-GLYCERIN EX PADS: 1.0000 "application " | MEDICATED_PAD | CUTANEOUS | Status: DC | PRN

## 2016-11-30 MED ORDER — BUPIVACAINE HCL 0.25 % IJ SOLN
INTRAMUSCULAR | Status: DC | PRN
  Administered 2016-11-30: 30 mL

## 2016-11-30 MED ORDER — SIMETHICONE 80 MG PO CHEW
80.0000 mg | CHEWABLE_TABLET | Freq: Three times a day (TID) | ORAL | Status: DC
  Administered 2016-11-30 – 2016-12-03 (×7): 80 mg via ORAL
  Filled 2016-11-30 (×8): qty 1

## 2016-11-30 MED ORDER — COCONUT OIL OIL
1.0000 "application " | TOPICAL_OIL | Status: DC | PRN
  Administered 2016-12-01: 1 via TOPICAL
  Filled 2016-11-30: qty 120

## 2016-11-30 MED ORDER — LABETALOL HCL 5 MG/ML IV SOLN
INTRAVENOUS | Status: AC
  Filled 2016-11-30: qty 4

## 2016-11-30 MED ORDER — CEFAZOLIN SODIUM-DEXTROSE 1-4 GM/50ML-% IV SOLN
INTRAVENOUS | Status: DC | PRN
  Administered 2016-11-30: 2 g via INTRAVENOUS

## 2016-11-30 MED ORDER — TETANUS-DIPHTH-ACELL PERTUSSIS 5-2.5-18.5 LF-MCG/0.5 IM SUSP
0.5000 mL | Freq: Once | INTRAMUSCULAR | Status: AC
  Administered 2016-12-01: 0.5 mL via INTRAMUSCULAR
  Filled 2016-11-30: qty 0.5

## 2016-11-30 MED ORDER — SIMETHICONE 80 MG PO CHEW
80.0000 mg | CHEWABLE_TABLET | ORAL | Status: DC
  Administered 2016-11-30 – 2016-12-02 (×3): 80 mg via ORAL
  Filled 2016-11-30 (×3): qty 1

## 2016-11-30 MED ORDER — MENTHOL 3 MG MT LOZG
1.0000 | LOZENGE | OROMUCOSAL | Status: DC | PRN
  Administered 2016-12-01: 3 mg via ORAL
  Filled 2016-11-30 (×2): qty 9

## 2016-11-30 MED ORDER — HYDROMORPHONE HCL 1 MG/ML IJ SOLN
0.2500 mg | INTRAMUSCULAR | Status: DC | PRN
  Administered 2016-11-30 (×4): 0.5 mg via INTRAVENOUS

## 2016-11-30 MED ORDER — PRENATAL MULTIVITAMIN CH
1.0000 | ORAL_TABLET | Freq: Every day | ORAL | Status: DC
  Administered 2016-12-01 – 2016-12-02 (×2): 1 via ORAL
  Filled 2016-11-30 (×2): qty 1

## 2016-11-30 MED ORDER — OXYTOCIN 10 UNIT/ML IJ SOLN
INTRAMUSCULAR | Status: AC
  Filled 2016-11-30: qty 4

## 2016-11-30 MED ORDER — FENTANYL CITRATE (PF) 100 MCG/2ML IJ SOLN: INTRAMUSCULAR | Status: DC | PRN

## 2016-11-30 MED ORDER — ACETAMINOPHEN 325 MG PO TABS: 650.0000 mg | ORAL_TABLET | ORAL | Status: DC | PRN

## 2016-11-30 MED ORDER — SUCCINYLCHOLINE CHLORIDE 20 MG/ML IJ SOLN
INTRAMUSCULAR | Status: DC | PRN
  Administered 2016-11-30: 140 mg via INTRAVENOUS

## 2016-11-30 MED ORDER — DIBUCAINE 1 % RE OINT: 1.0000 "application " | TOPICAL_OINTMENT | RECTAL | Status: DC | PRN

## 2016-11-30 SURGICAL SUPPLY — 31 items
BARRIER ADHS 3X4 INTERCEED (GAUZE/BANDAGES/DRESSINGS) IMPLANT
BRR ADH 4X3 ABS CNTRL BYND (GAUZE/BANDAGES/DRESSINGS)
CHLORAPREP W/TINT 26ML (MISCELLANEOUS) ×3 IMPLANT
CLAMP CORD UMBIL (MISCELLANEOUS) IMPLANT
CLOTH BEACON ORANGE TIMEOUT ST (SAFETY) ×3 IMPLANT
DRSG OPSITE POSTOP 4X10 (GAUZE/BANDAGES/DRESSINGS) ×3 IMPLANT
ELECT REM PT RETURN 9FT ADLT (ELECTROSURGICAL) ×3
ELECTRODE REM PT RTRN 9FT ADLT (ELECTROSURGICAL) ×1 IMPLANT
EXTRACTOR VACUUM KIWI (MISCELLANEOUS) IMPLANT
GLOVE BIO SURGEON STRL SZ 6.5 (GLOVE) ×2 IMPLANT
GLOVE BIO SURGEONS STRL SZ 6.5 (GLOVE) ×1
GLOVE BIOGEL PI IND STRL 7.0 (GLOVE) ×2 IMPLANT
GLOVE BIOGEL PI INDICATOR 7.0 (GLOVE) ×4
GOWN STRL REUS W/TWL LRG LVL3 (GOWN DISPOSABLE) ×6 IMPLANT
KIT ABG SYR 3ML LUER SLIP (SYRINGE) IMPLANT
NDL HYPO 25X5/8 SAFETYGLIDE (NEEDLE) IMPLANT
NEEDLE HYPO 22GX1.5 SAFETY (NEEDLE) IMPLANT
NEEDLE HYPO 25X5/8 SAFETYGLIDE (NEEDLE) IMPLANT
NS IRRIG 1000ML POUR BTL (IV SOLUTION) ×3 IMPLANT
PACK C SECTION WH (CUSTOM PROCEDURE TRAY) ×3 IMPLANT
PAD OB MATERNITY 4.3X12.25 (PERSONAL CARE ITEMS) ×3 IMPLANT
PENCIL SMOKE EVAC W/HOLSTER (ELECTROSURGICAL) ×3 IMPLANT
RETRACTOR WND ALEXIS 25 LRG (MISCELLANEOUS) IMPLANT
RTRCTR WOUND ALEXIS 25CM LRG (MISCELLANEOUS)
SUT VIC AB 0 CT1 36 (SUTURE) ×18 IMPLANT
SUT VIC AB 2-0 CT1 27 (SUTURE) ×3
SUT VIC AB 2-0 CT1 TAPERPNT 27 (SUTURE) ×1 IMPLANT
SUT VIC AB 4-0 PS2 27 (SUTURE) ×3 IMPLANT
SYR CONTROL 10ML LL (SYRINGE) IMPLANT
TOWEL OR 17X24 6PK STRL BLUE (TOWEL DISPOSABLE) ×3 IMPLANT
TRAY FOLEY BAG SILVER LF 14FR (SET/KITS/TRAYS/PACK) IMPLANT

## 2016-11-30 NOTE — Progress Notes (Signed)
pts BP elevated, pain meds given and pain scale down to 6, still remains hypertensive, Dr. Krista BlueSinger called and orders received.

## 2016-11-30 NOTE — Progress Notes (Signed)
Dr Krista BlueSinger made aware of pts current bp, okay given to transfer pt to floor.  Pt. Taken by NICU to see baby prior to transfer to floor.

## 2016-11-30 NOTE — Progress Notes (Signed)
Went in to assess pt at 0805 after nurse tech informed nurse of pt having lower abdominal discomfort. Pt complained of pain that felt like constipation and pressure in her lower abdomen to pelvis area, placed pt on monitor at 0808, Pt wanted to try and use the restroom, informed her that it was important to check on the fetal status and to make sure she wasn't having contractions. Highest heart rate observed on monitor was 85, placed O2 sensor on Pt to verify mothers heart rate which was in the 60s, 0811 called for the charge nurse to assist, Fetal heart rate in the 80's 0816 Called Harriett SineNancy Prothers CNM who was on call for [facility], Harriett Sineancy said that she was on her way in. 0819 OB Rapid response was called and responded shortly there after bringing a bedside ultrasound machine, [facility]  [phone] Called Faculty Practice to get a provider at bedside. [phone] Dr Debroah LoopArnold at Bedside performing ultrasound [phone] DR called a code Cesarean, [phone] house coverage called the emergency line and notified them of Code Cesarean, [phone] Anesthesia notified. 0827 Pt wheeled down to OR with bolus running attended by attending physician, pt nurse and charge nurse.

## 2016-11-30 NOTE — Op Note (Signed)
Sabrina PontAlexis Macadam PROCEDURE DATE: 11/30/2016  PREOPERATIVE DIAGNOSES: Intrauterine pregnancy at 6954w0d weeks gestation; non-reassuring fetal status  POSTOPERATIVE DIAGNOSES: Placental abruption  PROCEDURE: Primary Low Transverse Cesarean Section  SURGEON:  Adam PhenixArnold, Toiya Morrish G, MD   ASSISTANT:  None   INDICATIONS: Sabrina Pontlexis Coleman is a 21 y.o. [phone] at 5754w0d here for cesarean section secondary to the indications listed under preoperative diagnoses; please see preoperative note for further details.  The risks of cesarean section were discussed with the patient including but were not limited to: bleeding which may require transfusion or reoperation; infection which may require antibiotics; injury to bowel, bladder, ureters or other surrounding organs; injury to the fetus; need for additional procedures including hysterectomy in the event of a life-threatening hemorrhage; placental abnormalities wth subsequent pregnancies, incisional problems, thromboembolic phenomenon and other postoperative/anesthesia complications.   The patient concurred with the proposed plan, giving informed written consent for the procedure.    FINDINGS:  Viable female infant in cephalic presentation.  Apgars pending.  Clear amniotic fluid.  Intact placenta with evidence of abrution three vessel cord.  Normal uterus, fallopian tubes and ovaries bilaterally.  ANESTHESIA: GET INTRAVENOUS FLUIDS: 1200 ml ESTIMATED BLOOD LOSS: 500 ml URINE OUTPUT:  100 ml SPECIMENS: Placenta sent to pathology COMPLICATIONS: None immediate  PROCEDURE IN DETAIL:  The patient was taken to the OR 9 stat from antenatal with prolonged fetal bradycardia for emergency cesarean section.The patient preoperatively received intravenous antibiotics and had sequential compression devices applied to her lower extremities.  She was then taken to the operating room where general anesthesia was administered. She was then placed in a dorsal supine position with a  leftward tilt, and prepped and draped in a sterile manner.  A foley catheter was placed into her bladder and attached to constant gravity.  After an adequate timeout was performed, a Pfannenstiel skin incision was made with scalpel and carried through to the underlying layer of fascia. The fascia was incised in the midline, and this incision was extended bilaterally using the Mayo scissors.  Kocher clamps were applied to the superior aspect of the fascial incision and the underlying rectus muscles were dissected off bluntly.  A similar process was carried out on the inferior aspect of the fascial incision. The rectus muscles were separated in the midline bluntly and the peritoneum was entered bluntly. Attention was turned to the lower uterine segment where a low transverse hysterotomy was made with a scalpel and extended bilaterally bluntly.  The infant was successfully delivered, the cord was clamped and cut , and the infant was handed over to the awaiting neonatology team. Uterine massage was then administered, and the placenta delivered intact with a three-vessel cord with clots and suspected abruption. The uterus was then cleared of clots and debris.  The hysterotomy was closed with 0 Vicryl in a running locked fashion, and an imbricating layer was also placed with 0 Vicryl.  Figure-of-eight 0 Vicryl serosal stitches were placed to help with hemostasis.  The pelvis was cleared of all clot and debris. Hemostasis was confirmed on all surfaces.  The peritoneum was closed with a 0 Vicryl running stitch . The fascia was then closed using 0 Vicryl in a running fashion.  The subcutaneous layer was irrigated, and 30 ml of 0.5% Marcaine was injected subcutaneously around the incision.  The skin was closed with a 4-0 Vicryl subcuticular stitch. The patient tolerated the procedure well. Sponge, lap, instrument and needle counts were correct x 3.  She was taken to the recovery  room in stable condition.    Adam Phenix, MD  Attending Obstetrician & Gynecologist Faculty Practice, [facility]

## 2016-11-30 NOTE — Transfer of Care (Signed)
Immediate Anesthesia Transfer of Care Note  Patient: Sabrina Coleman  Procedure(s) Performed: Procedure(s): CESAREAN SECTION  Patient Location: PACU  Anesthesia Type:General  Level of Consciousness: awake, alert  and oriented  Airway & Oxygen Therapy: Patient Spontanous Breathing and Patient connected to nasal cannula oxygen  Post-op Assessment: Report given to RN and Post -op Vital signs reviewed and stable  Post vital signs: Reviewed and stable  Last Vitals:  Vitals:   11/30/16 0613 11/30/16 0757  BP: 136/64 (!) 161/84  Pulse: 65 61  Resp: 18 18  Temp: 36.9 C 36.8 C    Last Pain:  Vitals:   11/30/16 0757  TempSrc: Oral  PainSc:          Complications: No apparent anesthesia complications

## 2016-11-30 NOTE — Progress Notes (Signed)
Pt came to OR from high risk OB unit, code cesarean for fetal HR indication. Pt combative during prep/foley placement. Pt placed under general anesthesia for cesarean section. Pt to PACU for recovery

## 2016-11-30 NOTE — Progress Notes (Signed)
Patient ID: Sabrina Coleman, female   DOB: Apr 29, 1996, 21 y.o.   MRN: [id number] [place]) NOTE  Sabrina Coleman is a 21 y.o. G2P0010 at 60w0dby best clinical estimate who is admitted for vaginal bleeding in the third trimester.   Fetal presentation is cephalic. Length of Stay:  4  Days  Subjective: Patient denies further bleeding. Patient reports the fetal movement as active. Patient reports uterine contraction  activity as none. Patient reports  vaginal bleeding as none. Patient describes fluid per vagina as None.  Vitals:  Blood pressure 136/64, pulse 65, temperature 98.5 F (36.9 C), temperature source Oral, resp. rate 18, height '5\' 6"'  (1.676 m), weight 219 lb (99.3 kg), last menstrual period 04/18/2016, SpO2 98 %. Physical Examination:  General appearance - alert, well appearing, and in no distress Chest - normal effort Heart - S1 and S2 normal Abdomen - gravid, NT Fundal Height:  size equals dates Extremities: Homans sign is negative, no sign of DVT  Membranes:intact  Fetal Monitoring:  Baseline: 150 bpm, Variability: Good {> 6 bpm), Accelerations: Non-reactive but appropriate for gestational age and Decelerations: Absent x2  Labs:   Results for orders placed or performed during the hospital encounter of 11/26/16 (from the past 72 hour(s))  CBC     Status: Abnormal   Collection Time: 11/28/16  8:26 AM  Result Value Ref Range   WBC 21.9 (H) 4.0 - 10.5 K/uL   RBC 3.82 (L) 3.87 - 5.11 MIL/uL   Hemoglobin 12.6 12.0 - 15.0 g/dL   HCT 36.9 36.0 - 46.0 %   MCV 96.6 78.0 - 100.0 fL   MCH 33.0 26.0 - 34.0 pg   MCHC 34.1 30.0 - 36.0 g/dL   RDW 13.5 11.5 - 15.5 %   Platelets 232 150 - 400 K/uL  Comprehensive metabolic panel     Status: Abnormal   Collection Time: 11/28/16  8:26 AM  Result Value Ref Range   Sodium 135 135 - 145 mmol/L   Potassium 4.0 3.5 - 5.1 mmol/L   Chloride 105 101 - 111 mmol/L   CO2 23 22 - 32 mmol/L   Glucose, Bld 96 65 -  99 mg/dL   BUN 14 6 - 20 mg/dL   Creatinine, Ser 0.75 0.44 - 1.00 mg/dL   Calcium 9.0 8.9 - 10.3 mg/dL   Total Protein 6.6 6.5 - 8.1 g/dL   Albumin 2.9 (L) 3.5 - 5.0 g/dL   AST 15 15 - 41 U/L   ALT 11 (L) 14 - 54 U/L   Alkaline Phosphatase 95 38 - 126 U/L   Total Bilirubin 0.4 0.3 - 1.2 mg/dL   GFR calc non Af Amer >60 >60 mL/min   GFR calc Af Amer >60 >60 mL/min    Comment: (NOTE) The eGFR has been calculated using the CKD EPI equation. This calculation has not been validated in all clinical situations. eGFR's persistently <60 mL/min signify possible Chronic Kidney Disease.    Anion gap 7 5 - 15  Protein / creatinine ratio, urine     Status: Abnormal   Collection Time: 11/28/16 10:00 AM  Result Value Ref Range   Creatinine, Urine 45.00 mg/dL   Total Protein, Urine 12 mg/dL    Comment: NO NORMAL RANGE ESTABLISHED FOR THIS TEST   Protein Creatinine Ratio 0.27 (H) 0.00 - 0.15 mg/mg[Cre]  Comprehensive metabolic panel     Status: Abnormal   Collection Time: 11/29/16  5:23 AM  Result Value Ref Range   Sodium  137 135 - 145 mmol/L   Potassium 3.9 3.5 - 5.1 mmol/L   Chloride 109 101 - 111 mmol/L   CO2 22 22 - 32 mmol/L   Glucose, Bld 93 65 - 99 mg/dL   BUN 16 6 - 20 mg/dL   Creatinine, Ser 0.70 0.44 - 1.00 mg/dL   Calcium 8.4 (L) 8.9 - 10.3 mg/dL   Total Protein 5.7 (L) 6.5 - 8.1 g/dL   Albumin 2.6 (L) 3.5 - 5.0 g/dL   AST 14 (L) 15 - 41 U/L   ALT 11 (L) 14 - 54 U/L   Alkaline Phosphatase 105 38 - 126 U/L   Total Bilirubin 0.6 0.3 - 1.2 mg/dL   GFR calc non Af Amer >60 >60 mL/min   GFR calc Af Amer >60 >60 mL/min    Comment: (NOTE) The eGFR has been calculated using the CKD EPI equation. This calculation has not been validated in all clinical situations. eGFR's persistently <60 mL/min signify possible Chronic Kidney Disease.    Anion gap 6 5 - 15  CBC     Status: Abnormal   Collection Time: 11/29/16  5:23 AM  Result Value Ref Range   WBC 16.3 (H) 4.0 - 10.5 K/uL   RBC  3.55 (L) 3.87 - 5.11 MIL/uL   Hemoglobin 11.8 (L) 12.0 - 15.0 g/dL   HCT 34.1 (L) 36.0 - 46.0 %   MCV 96.1 78.0 - 100.0 fL   MCH 33.2 26.0 - 34.0 pg   MCHC 34.6 30.0 - 36.0 g/dL   RDW 13.5 11.5 - 15.5 %   Platelets 204 150 - 400 K/uL  Type and screen [place]     Status: None   Collection Time: 11/29/16  5:23 AM  Result Value Ref Range   ABO/RH(D) A POS    Antibody Screen NEG    Sample Expiration 12/02/2016     Medications:  Scheduled . docusate sodium  100 mg Oral Daily  . labetalol  200 mg Oral BID  . prenatal multivitamin  1 tablet Oral Q1200   I have reviewed the patient's current medications.  ASSESSMENT: Active Problems:   Vaginal bleeding in pregnancy, third trimester   Gestational hypertension   PLAN: 24 hour urine is still pending; elevated BP yesterday but no severe range since she started Labetolol.  12/03/2016 will be 1 wk without bleeding Continue inpatient observation  Sabrina Schneiders, MD 11/30/2016,6:28 AM

## 2016-11-30 NOTE — Anesthesia Procedure Notes (Signed)
Procedure Name: Intubation Date/Time: 11/30/2016 8:36 AM Performed by: Rica RecordsICKELTON, Sabrina Boeve Pre-anesthesia Checklist: Patient identified, Emergency Drugs available, Suction available and Patient being monitored Patient Re-evaluated:Patient Re-evaluated prior to inductionOxygen Delivery Method: Circle system utilized Preoxygenation: Pre-oxygenation with 100% oxygen Intubation Type: IV induction, Rapid sequence and Cricoid Pressure applied Laryngoscope Size: Miller and 2 Grade View: Grade I Tube type: Oral Tube size: 7.0 mm Number of attempts: 1 Airway Equipment and Method: Rigid stylet Placement Confirmation: ETT inserted through vocal cords under direct vision,  positive ETCO2 and breath sounds checked- equal and bilateral Secured at: 22 cm Tube secured with: Tape Dental Injury: Teeth and Oropharynx as per pre-operative assessment

## 2016-11-30 NOTE — Anesthesia Postprocedure Evaluation (Signed)
Anesthesia Post Note  Patient: Sabrina Coleman  Procedure(s) Performed: Procedure(s): CESAREAN SECTION  Patient location during evaluation: PACU Anesthesia Type: General Level of consciousness: sedated Pain management: pain level controlled Vital Signs Assessment: post-procedure vital signs reviewed and stable Respiratory status: spontaneous breathing and respiratory function stable Cardiovascular status: stable Anesthetic complications: no        Last Vitals:  Vitals:   11/30/16 1045 11/30/16 1130  BP:  (!) 131/103  Pulse:  69  Resp:  18  Temp: 37.1 C     Last Pain:  Vitals:   11/30/16 1130  TempSrc:   PainSc: 3    Pain Goal:                 [name]

## 2016-11-30 NOTE — Anesthesia Preprocedure Evaluation (Signed)
Anesthesia Evaluation  Patient identified by MRN, date of birth, ID band Patient awake    Reviewed: Allergy & Precautions, NPO status , Patient's Chart, lab work & pertinent test results, Unable to perform ROS - Chart review only  History of Anesthesia Complications Negative for: history of anesthetic complications  Airway Mallampati: II  TM Distance: >3 FB Neck ROM: Full    Dental no notable dental hx.    Pulmonary neg pulmonary ROS,    Pulmonary exam normal        Cardiovascular hypertension, Normal cardiovascular exam     Neuro/Psych negative neurological ROS  negative psych ROS   GI/Hepatic negative GI ROS, Neg liver ROS,   Endo/Other  Morbid obesity  Renal/GU negative Renal ROS     Musculoskeletal   Abdominal   Peds  Hematology   Anesthesia Other Findings   Reproductive/Obstetrics (+) Pregnancy                             Anesthesia Physical Anesthesia Plan  ASA: II and emergent  Anesthesia Plan: General   Post-op Pain Management:    Induction: Intravenous, Rapid sequence and Cricoid pressure planned  Airway Management Planned: Oral ETT  Additional Equipment:   Intra-op Plan:   Post-operative Plan: Extubation in OR  Informed Consent: I have reviewed the patients History and Physical, chart, labs and discussed the procedure including the risks, benefits and alternatives for the proposed anesthesia with the patient or authorized representative who has indicated his/her understanding and acceptance.   Dental advisory given  Plan Discussed with: Anesthesiologist, CRNA and Surgeon  Anesthesia Plan Comments:         Anesthesia Quick Evaluation

## 2016-11-30 NOTE — Progress Notes (Signed)
Pt requesting to go to NICU. Pt without complaints of pain or N&V. Pt walked to bathroom and pericare completed. Carmelina DaneERRI L Naftula Donahue, RN

## 2016-12-01 ENCOUNTER — Encounter (HOSPITAL_COMMUNITY): Payer: Self-pay | Admitting: Obstetrics & Gynecology

## 2016-12-01 LAB — CBC
HEMATOCRIT: 28.6 % — AB (ref 36.0–46.0)
Hemoglobin: 9.9 g/dL — ABNORMAL LOW (ref 12.0–15.0)
MCH: 33.4 pg (ref 26.0–34.0)
MCHC: 34.6 g/dL (ref 30.0–36.0)
MCV: 96.6 fL (ref 78.0–100.0)
Platelets: 182 10*3/uL (ref 150–400)
RBC: 2.96 MIL/uL — ABNORMAL LOW (ref 3.87–5.11)
RDW: 13.8 % (ref 11.5–15.5)
WBC: 25.7 10*3/uL — AB (ref 4.0–10.5)

## 2016-12-01 NOTE — Progress Notes (Signed)
Subjective:good pain relief Postpartum Day 1: Cesarean Delivery Patient reports incisional pain, tolerating PO and + flatus.    Objective: Vital signs in last 24 hours: Temp:  [97.6 F (36.4 C)-98.7 F (37.1 C)] 98.1 F (36.7 C) (05/14 0449) Pulse Rate:  [61-92] 90 (05/14 0449) Resp:  [13-18] 16 (05/14 0449) BP: (123-161)/(55-103) 151/79 (05/14 0449) SpO2:  [97 %-100 %] 100 % (05/14 0449)  Physical Exam:  General: alert, cooperative and no distress Lochia: appropriate Uterine Fundus: firm Incision: healing well, no significant drainage DVT Evaluation: No evidence of DVT seen on physical exam.   Recent Labs  11/29/16 0523 12/01/16 0544  HGB 11.8* 9.9*  HCT 34.1* 28.6*    Assessment/Plan: Status post Cesarean section. Doing well postoperatively.  Continue current care.  Scheryl DarterJames Arnold 12/01/2016, 7:38 AM

## 2016-12-01 NOTE — Lactation Note (Signed)
This note was copied from a baby's chart. Lactation Consultation Note  Patient Name: Sabrina Coleman [id number]'s Date: 12/01/2016 Reason for consult: Initial assessment  NICU baby 230 hours old. Mom reports that she has not been pumping every 3 hours, and that she was able to collect more colostrum yesterday than she is seeing today. Discussed supply and demand and the progression of milk coming to volume. Enc mom to pump at least 8 times/24 hours followed by hand expression. Mom reports that she is not active with WIC right now and does not want this [facility] she may need a pump at delivery. Enc mom to call her insurance company--mom states that she has Medicaid and private insurance--to discuss getting a breast pump. Discussed the benefits of a hospital-grade DEBP and mom aware of rental program--and [facility]. Enc mom to call for assistance as needed.   Maternal Data Has patient been taught Hand Expression?: Yes (Per mom.) Does the patient have breastfeeding experience prior to this delivery?: No  Feeding    LATCH Score/Interventions                      Lactation Tools Discussed/Used WIC Program: No Pump Review: Setup, frequency, and cleaning;Milk Storage Initiated by:: bedside RN Date initiated:: 11/30/16   Consult Status Consult Status: Follow-up Date: 12/02/16 Follow-up type: In-patient    Sherlyn HayJennifer D Capria Cartaya 12/01/2016, 2:53 PM

## 2016-12-01 NOTE — Addendum Note (Signed)
Addendum  created 12/01/16 0837 by Shanon PayorGregory, Daimen Shovlin M, CRNA   Sign clinical note

## 2016-12-01 NOTE — Anesthesia Postprocedure Evaluation (Addendum)
Anesthesia Post Note  Patient: Sabrina Coleman  Procedure(s) Performed: Procedure(s): CESAREAN SECTION  Patient location during evaluation: Women's Unit Anesthesia Type: General Level of consciousness: awake and alert and oriented Pain management: pain level controlled Vital Signs Assessment: post-procedure vital signs reviewed and stable Respiratory status: spontaneous breathing, nonlabored ventilation and respiratory function stable Cardiovascular status: stable Postop Assessment: adequate PO intake and no signs of nausea or vomiting Anesthetic complications: no        Last Vitals:  Vitals:   12/01/16 0449 12/01/16 0757  BP: (!) 151/79 (!) 148/85  Pulse: 90 78  Resp: 16 16  Temp: 36.7 C 36.7 C    Last Pain:  Vitals:   12/01/16 0757  TempSrc: Oral  PainSc:    Pain Goal: Patients Stated Pain Goal: 2 (12/01/16 0600)               Madison HickmanGREGORY,SUZANNE

## 2016-12-02 ENCOUNTER — Encounter (HOSPITAL_COMMUNITY): Payer: Self-pay | Admitting: *Deleted

## 2016-12-02 NOTE — Lactation Note (Signed)
This note was copied from a baby's chart. Lactation Consultation Note  Patient Name: Sabrina Coleman [id number] Date: 12/02/2016 Reason for consult: Follow-up assessment;NICU baby  NICU baby 61 hours old. Called by patient's bedside nurse to discuss getting a DEBP. Mom gave permission for the [facility] to send BF referral to [facility]. Mom enc to discuss with WIC in the morning--12/03/16. Mom aware of [facility] loaner program. Enc mom to take pumping kit with her at D/C.   Maternal Data    Feeding    LATCH Score/Interventions                      Lactation Tools Discussed/Used     Consult Status Consult Status: Follow-up Date: 12/03/16 Follow-up type: In-patient    Andres Labrum 12/02/2016, 7:02 PM
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
## 2016-12-02 NOTE — Lactation Note (Signed)
This note was copied from a baby's chart. Lactation Consultation Note  Patient Name: Girl Lorelei Pontlexis Higginbotham ZOXWR'UToday's Date: 12/02/2016 Reason for consult: Follow-up assessment;NICU baby  NICU baby 6150 hours old. Mom reports that she pumped yesterday and was able to collect between 10 and 15 ml of EBM. Mom states that baby isn't taking the EBM now, but understands the need to keep pumping to increase the EBM amounts. Mom states that she has not pumped today--over 12 hours since last pumping session--but intends to pump after she eats. Enc mom to pump every 2-3 hours for a total of 8 times/24 hours. Mom reports that she hopes to pump more often today. Enc mom to call for assistance as needed, and enc calling early on day of D/C if wanting a pump.   Maternal Data    Feeding    LATCH Score/Interventions                      Lactation Tools Discussed/Used     Consult Status Consult Status: Follow-up Date: 12/03/16 Follow-up type: In-patient    Sherlyn HayJennifer D Cato Liburd 12/02/2016, 11:11 AM

## 2016-12-02 NOTE — Progress Notes (Signed)
POSTPARTUM PROGRESS NOTE  Post Operative Day #2  Subjective:  Sabrina Coleman is a 21 y.o. [id number] 5144w0d s/p PLTCS.  No acute events overnight.  Pt denies problems with ambulating, voiding or po intake.  She denies nausea or vomiting.  Pain is well controlled.  She has had flatus. She has not had bowel movement.   Objective: Blood pressure (!) 147/82, pulse 100, temperature 98.9 F (37.2 C), temperature source Oral, resp. rate 16, height 5\' 6"  (1.676 m), weight 219 lb (99.3 kg), last menstrual period 04/18/2016, SpO2 100 %, unknown if currently breastfeeding.  Physical Exam:  General: alert, cooperative and no distress Lochia:normal flow Chest: no respiratory distress Heart:regular rate, distal pulses intact Incision: minima leakage, dry Abdomen: soft, nontender,  Uterine Fundus: firm, appropriately tender DVT Evaluation: No calf swelling or tenderness Extremities: minimal edema   Recent Labs  12/01/16 0544  HGB 9.9*  HCT 28.6*    Assessment/Plan:  ASSESSMENT: Sabrina Pontlexis Christianson is a 21 y.o. [id number] 7544w0d s/p PLTCS will continue to monitor. Patient is progressing well, follow up on BM change bowel regimen. BP has been within normal limit. Baby is in NICU.  Plan for discharge tomorrow   LOS: 6 days   Lovena NeighboursAbdoulaye Diallo, MD 12/02/2016, 8:43 AM   OB FELLOW POSTPARTUM PROGRESS NOTE ATTESTATION  I confirm that I have verified the information documented in the resident's note and that I have also personally reperformed the physical exam and all medical decision making activities.     Ernestina PennaNicholas Celia Friedland, MD 9:31 AM

## 2016-12-03 MED ORDER — FERROUS GLUCONATE 324 (38 FE) MG PO TABS: 324.0000 mg | ORAL_TABLET | Freq: Two times a day (BID) | ORAL | 0 refills | Status: DC

## 2016-12-03 MED ORDER — IBUPROFEN 600 MG PO TABS: 600.0000 mg | ORAL_TABLET | Freq: Four times a day (QID) | ORAL | 1 refills | Status: DC | PRN

## 2016-12-03 MED ORDER — LABETALOL HCL 200 MG PO TABS: 200.0000 mg | ORAL_TABLET | Freq: Two times a day (BID) | ORAL | 0 refills | Status: DC

## 2016-12-03 MED ORDER — OXYCODONE-ACETAMINOPHEN 5-325 MG PO TABS: 1.0000 | ORAL_TABLET | ORAL | 0 refills | Status: DC | PRN

## 2016-12-03 MED ORDER — DOCUSATE SODIUM 100 MG PO CAPS: 100.0000 mg | ORAL_CAPSULE | Freq: Two times a day (BID) | ORAL | 1 refills | Status: DC

## 2016-12-03 NOTE — Discharge Summary (Signed)
Obstetrical Discharge Summary  Date of Admission: 11/26/2016 Date of Discharge: 12/03/2016  Primary OB: [facility]  Gestational Age at Delivery: [id number]   Antepartum complications: THC use, BMI 30s, h/o STDs, h/o UTIs. Diagnosed with gestational HTN on admission Reason for Admission: Patient admitted for vaginal bleeding after intercourse Date of Delivery: 11/30/2016  Delivered By: Scheryl DarterJames Arnold MD Delivery Type: stat primary LTCS Intrapartum complications/course: Abruptio Placenta Anesthesia: general Placenta: Delivered and expressed via active management. Intact: yes. To pathology: yes.  Laceration: n/a  Episiotomy: none EBL: 500mL Baby: Liveborn female, APGARs 0/0/0, weight 910 g.    Discharge Diagnosis: Delivered.   Postpartum course: No issues. Routine postop/postpartum course. +flatus by day of discharge.  Discharge Vital Signs:  Current Vital Signs 24h Vital Sign Ranges  T 98.8 F (37.1 C) Temp  Avg: 98.8 F (37.1 C)  Min: 98.7 F (37.1 C)  Max: 98.9 F (37.2 C)  BP 133/72 BP  Min: 118/83  Max: 133/72  HR 97 Pulse  Avg: 94  Min: 90  Max: 97  RR 18 Resp  Avg: 18  Min: 18  Max: 18  SaO2 99 % Not Delivered SpO2  Avg: 99.3 %  Min: 99 %  Max: 100 %       24 Hour I/O Current Shift I/O  Time Ins Outs No intake/output data recorded. No intake/output data recorded.   Discharge Exam:  NAD Perineum: deferred Abdomen: firm fundus below the umbilicus, NTTP, ND.  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 Request sent to clinic for 1wk incision and PP check  Discharge Medications: Allergies as of 12/03/2016   No Known Allergies     Medication List    STOP taking these medications   amoxicillin-clavulanate 875-125 MG tablet Commonly known as:  AUGMENTIN     TAKE these medications   docusate sodium 100 MG capsule Commonly known as:  COLACE Take 1 capsule (100 mg total) by mouth 2 (two) times daily.   ferrous gluconate 324 MG tablet Commonly known as:  FERGON Take 1 tablet (324 mg total) by mouth 2 (two) times daily with a meal.   ibuprofen 600 MG tablet Commonly known as:  ADVIL,MOTRIN Take 1 tablet (600 mg total) by mouth every 6 (six) hours as needed.   labetalol 200 MG tablet Commonly known as:  NORMODYNE Take 1 tablet (200 mg total) by mouth 2 (two) times daily.   moxifloxacin 0.5 % ophthalmic solution Commonly known as:  VIGAMOX Place 1 drop into both eyes 3 (three) times daily.   oxyCODONE-acetaminophen 5-325 MG tablet Commonly known as:  PERCOCET/ROXICET Take 1-2 tablets by mouth every 4 (four) hours as needed for moderate pain or severe pain.   PRENATE PIXIE 10-0.6-0.4-200 MG Caps Take 1 tablet by mouth daily.   ranitidine 150 MG capsule Commonly known as:  ZANTAC Take 1 capsule (150 mg total) by mouth daily.  Durene Romans MD Attending [facility])

## 2016-12-03 NOTE — Discharge Instructions (Signed)
°Cesarean Delivery, Care After °Refer to this sheet in the next few weeks. These instructions provide you with information on caring for yourself after your procedure. Your health care provider may also give you specific instructions. Your treatment has been planned according to current medical practices, but problems sometimes occur. Call your health care provider if you have any problems or questions after you go home. °HOME CARE INSTRUCTIONS  °· Only take over-the-counter or prescription medications as directed by your health care provider. °· Do not drink alcohol, especially if you are breastfeeding or taking medication to relieve pain. °· Do not  smoke tobacco. °· Continue to use good perineal care. Good perineal care includes: °¨ Wiping your perineum from front to back. °¨ Keeping your perineum clean. °· Check your surgical cut (incision) daily for increased redness, drainage, swelling, or separation of skin. °· Shower and clean your incision gently with soap and water every day, by letting warm and soapy water run over the incision, and then pat it dry. If your health care provider says it is okay, leave the incision uncovered. Use a bandage (dressing) if the incision is draining fluid or appears irritated. If the adhesive strips across the incision do not fall off within 7 days, carefully peel them off, after a shower. °· Hug a pillow when coughing or sneezing until your incision is healed. This helps to relieve pain. °· Do not use tampons, douches or have sexual intercourse, until your health care provider says it is okay. °· Wear a well-fitting bra that provides breast support. °· Limit wearing support panties or control-top hose. °· Drink enough fluids to keep your urine clear or pale yellow. °· Eat high-fiber foods such as whole grain cereals and breads, brown rice, beans, and fresh fruits and vegetables every day. These foods may help prevent or relieve constipation. °· Resume activities such as  climbing stairs, driving, lifting, exercising, or traveling as directed by your health care provider. °· Try to have someone help you with your household activities and your newborn for at least a few days after you leave the hospital. °· Rest as much as possible. Try to rest or take a nap when your newborn is sleeping. °· Increase your activities gradually. °· Do not lift more than 15lbs until directed by a provider. °· Keep all of your scheduled postpartum appointments. It is very important to keep your scheduled follow-up appointments. At these appointments, your health care provider will be checking to make sure that you are healing physically and emotionally. °SEEK MEDICAL CARE IF:  °· You are passing large clots from your vagina. Save any clots to show your health care provider. °· You have a foul smelling discharge from your vagina. °· You have trouble urinating. °· You are urinating frequently. °· You have pain when you urinate. °· You have a change in your bowel movements. °· You have increasing redness, pain, or swelling near your incision. °· You have pus draining from your incision. °· Your incision is separating. °· You have painful, hard, or reddened breasts. °· You have a severe headache. °· You have blurred vision or see spots. °· You feel sad or depressed. °· You have thoughts of hurting yourself or your newborn. °· You have questions about your care, the care of your newborn, or medications. °· You are dizzy or light-headed. °· You have a rash. °· You have pain, redness, or swelling at the site of the removed intravenous access (IV) tube. °· You have nausea   or vomiting. °· You stopped breastfeeding and have not had a menstrual period within 12 weeks of stopping. °· You are not breastfeeding and have not had a menstrual period within 12 weeks of delivery. °· You have a fever. °SEEK IMMEDIATE MEDICAL CARE IF: °· You have persistent pain. °· You have chest pain. °· You have shortness of breath. °· You  faint. °· You have leg pain. °· You have stomach pain. °· Your vaginal bleeding saturates 2 or more sanitary pads in 1 hour. °MAKE SURE YOU:  °· Understand these instructions. °· Will watch your condition. °· Will get help right away if you are not doing well or get worse. °Document Released: 03/29/2002 Document Revised: 11/21/2013 Document Reviewed: 03/03/2012 °Exit[employer]® Patient Information ©2015 [employer]. This information is not intended to replace advice given to you by your health care provider. Make sure you discuss any questions you have with your health care provider. ° ° °

## 2016-12-03 NOTE — Progress Notes (Signed)

## 2016-12-03 NOTE — Lactation Note (Signed)
This note was copied from a baby's chart. Lactation Consultation Note  Patient Name: Sabrina Coleman ZOXWR'UToday's Date: 12/03/2016 Reason for consult: Follow-up assessment;NICU baby;Infant < 6lbs   Attempted to see mom in her room, she was in NICU. Visited mom in the NICU at the infant's bedside. Mom reports infant is progressing. Mom is planning to call [facility] this morning to inquire about a pump and is planning to have LC called if she wants to rent a pump prior to d/c. Mom reports she does not have any questions and [facility]. Enc mom to call when she decides on whether she needs a pump.    Maternal Data    Feeding    LATCH Score/Interventions                      Lactation Tools Discussed/Used     Consult Status Consult Status: Follow-up Date: 12/04/16 Follow-up type: In-patient    Silas FloodSharon S Ruston Fedora 12/03/2016, 10:53 AM

## 2016-12-08 ENCOUNTER — Telehealth: Payer: Self-pay

## 2016-12-08 NOTE — Telephone Encounter (Signed)
Called pt Left message to schedule 1 week incision check for this week.

## 2016-12-10 ENCOUNTER — Ambulatory Visit: Payer: BLUE CROSS/BLUE SHIELD | Admitting: Obstetrics and Gynecology

## 2016-12-20 NOTE — Addendum Note (Signed)
Addendum  created 12/20/16 1000 by Andrea Colglazier, MD   Sign clinical note    

## 2016-12-22 ENCOUNTER — Ambulatory Visit (INDEPENDENT_AMBULATORY_CARE_PROVIDER_SITE_OTHER): Payer: BLUE CROSS/BLUE SHIELD | Admitting: Certified Nurse Midwife

## 2016-12-22 ENCOUNTER — Encounter: Payer: Self-pay | Admitting: Certified Nurse Midwife

## 2016-12-22 DIAGNOSIS — Z3400 Encounter for supervision of normal first pregnancy, unspecified trimester: Secondary | ICD-10-CM

## 2016-12-22 DIAGNOSIS — Z98891 History of uterine scar from previous surgery: Secondary | ICD-10-CM

## 2016-12-22 MED ORDER — PRENATE PIXIE 10-0.6-0.4-200 MG PO CAPS: 1.0000 | ORAL_CAPSULE | Freq: Every day | ORAL | 12 refills | Status: DC

## 2016-12-22 NOTE — Progress Notes (Signed)
.  2 Week Post Partum Exam S/P C-section  Sabrina Coleman is a 21 y.o. [id number] female who presents for a postpartum visit. She is 3 weeks postpartum following a low cervical transverse Cesarean section. I have fully reviewed the prenatal and intrapartum course. The delivery was at 29 gestational weeks.  Anesthesia: spinal. Postpartum course has been normal. Baby's course has been NICU admission for prematurity, doing well. Baby is feeding by both breast and bottle - [place]. Bleeding no bleeding. Bowel function is normal. Bladder function is normal. Patient is sexually active. Contraception method is Nexplanon. Postpartum depression screening:neg  The following portions of the patient's history were reviewed and updated as appropriate: allergies, current medications, past family history, past medical history, past social history, past surgical history and problem list.  Review of Systems Pertinent items noted in HPI and remainder of comprehensive ROS otherwise negative.    Objective:  unknown if currently breastfeeding.  General:  alert, cooperative and no distress   Breasts:  inspection negative, no nipple discharge or bleeding, no masses or nodularity palpable  Lungs: clear to auscultation bilaterally  Heart:  regular rate and rhythm, S1, S2 normal, no murmur, click, rub or gallop  Abdomen: soft, non-tender; bowel sounds normal; no masses,  no organomegaly.  C-section wound C/D/I, no s/s infection. Wound edges well approximated.   Pelvic Exam: Not performed.        Assessment:    Normal 2 week postpartum exam. Pap smear not done at today's visit.   R/O UTI  Plan:   1. Contraception: none.  Last sexual intercourse 1 week ago, no condom usage.  2.  Nexplanon and annual exam in 2 weeks.  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  3. Follow up in: 1 month for Nexplanon/annual exam or as needed.

## 2017-01-07 ENCOUNTER — Ambulatory Visit: Payer: Self-pay

## 2017-01-07 NOTE — Lactation Note (Signed)
This note was copied from a baby's chart. Lactation Consultation Note  Patient Name: Sabrina Coleman WUJWJ'XToday's Date: 01/07/2017 Reason for consult: Follow-up assessment;NICU baby;Infant < 6lbs   Spoke with mom at infants bedside. She reports she is still pumping when she can. She reports she works a lot and has a hard time pumping all the time. She has no questions/concerns at this time.    Maternal Data    Feeding Feeding Type: Donor Breast Milk Length of feed: 30 min  LATCH Score/Interventions                      Lactation Tools Discussed/Used     Consult Status Consult Status: PRN Follow-up type: Call as needed    Ed BlalockSharon S Hice 01/07/2017, 2:40 PM

## 2017-01-12 ENCOUNTER — Ambulatory Visit: Payer: BLUE CROSS/BLUE SHIELD | Admitting: Obstetrics and Gynecology

## 2017-01-12 ENCOUNTER — Ambulatory Visit: Payer: Self-pay

## 2017-01-12 NOTE — Lactation Note (Signed)
This note was copied from a baby's chart. Lactation Consultation Note  Patient Name: Girl Lorelei Pontlexis Spratley ZOXWR'UToday's Date: 01/12/2017  Mom has not brought in breast milk.  Baby is 426 weeks old.  Mom is vague about pumping and states she is going to really get on it.  Reviewed supply and demand and instructed to pump 8-12 times per day.  Mom has a [employer] pump.   Maternal Data    Feeding Feeding Type: Donor Breast Milk Length of feed: 30 min  LATCH Score/Interventions                      Lactation Tools Discussed/Used     Consult Status      Huston FoleyMOULDEN, Brie Eppard S 01/12/2017, 3:29 PM

## 2017-01-14 ENCOUNTER — Ambulatory Visit (INDEPENDENT_AMBULATORY_CARE_PROVIDER_SITE_OTHER): Payer: BLUE CROSS/BLUE SHIELD | Admitting: Obstetrics and Gynecology

## 2017-01-14 ENCOUNTER — Encounter: Payer: Self-pay | Admitting: Obstetrics and Gynecology

## 2017-01-14 VITALS — BP 103/70 | HR 88 | Wt 208.9 lb

## 2017-01-14 DIAGNOSIS — R87612 Low grade squamous intraepithelial lesion on cytologic smear of cervix (LGSIL): Secondary | ICD-10-CM

## 2017-01-14 DIAGNOSIS — Z98891 History of uterine scar from previous surgery: Secondary | ICD-10-CM

## 2017-01-14 DIAGNOSIS — Z30011 Encounter for initial prescription of contraceptive pills: Secondary | ICD-10-CM

## 2017-01-14 DIAGNOSIS — Z309 Encounter for contraceptive management, unspecified: Secondary | ICD-10-CM | POA: Insufficient documentation

## 2017-01-14 MED ORDER — NORETHINDRONE 0.35 MG PO TABS: 1.0000 | ORAL_TABLET | Freq: Every day | ORAL | 11 refills | Status: DC

## 2017-01-14 NOTE — Progress Notes (Signed)
Patient is in the office for pp follow up, c section delivery 11-30-16 at 29 weeks, pt is breastfeeding and states baby is doing great. [facility] PP depression score- 0.

## 2017-01-14 NOTE — Progress Notes (Signed)
Patient ID: Sabrina Coleman, female   DOB: [birth year], 21 y.o.   MRN: [id number] Pt here for second postpartum visit. She is doing well no complaints. She had a c section d/t placental abruption at 28 weeks. Was Dx with GHTN as well. No meds presently.  Breast feeding. Bleeding has stopped. Denies any bowel or bladder dysfunction.  Infant doing well but remains in NICU.  PE AF VSS Lungs clear Heart RRR Abd soft + BS incision healing  GU Deferred  A/P Post partum visit        Abnormal pap smear  Return to normal ADL's. Repeat pap smear Jan 2019. Micronor for contraception. U/R/B and back up method reviewed with pt.

## 2017-01-14 NOTE — Patient Instructions (Signed)
Oral Contraception Use Oral contraceptive pills (OCPs) are medicines taken to prevent pregnancy. OCPs work by preventing the ovaries from releasing eggs. The hormones in OCPs also cause the cervical mucus to thicken, preventing the sperm from entering the uterus. The hormones also cause the uterine lining to become thin, not allowing a fertilized egg to attach to the inside of the uterus. OCPs are highly effective when taken exactly as prescribed. However, OCPs do not prevent sexually transmitted diseases (STDs). Safe sex practices, such as using condoms along with an OCP, can help prevent STDs. Before taking OCPs, you may have a physical exam and Pap test. Your health care provider may also order blood tests if necessary. Your health care provider will make sure you are a good candidate for oral contraception. Discuss with your health care provider the possible side effects of the OCP you may be prescribed. When starting an OCP, it can take 2 to 3 months for the body to adjust to the changes in hormone levels in your body. How to take oral contraceptive pills Your health care provider may advise you on how to start taking the first cycle of OCPs. Otherwise, you can:  Start on day 1 of your menstrual period. You will not need any backup contraceptive protection with this start time.  Start on the first Sunday after your menstrual period or the day you get your prescription. In these cases, you will need to use backup contraceptive protection for the first week.  Start the pill at any time of your cycle. If you take the pill within 5 days of the start of your period, you are protected against pregnancy right away. In this case, you will not need a backup form of birth control. If you start at any other time of your menstrual cycle, you will need to use another form of birth control for 7 days. If your OCP is the type called a minipill, it will protect you from pregnancy after taking it for 2 days (48  hours).  After you have started taking OCPs:  If you forget to take 1 pill, take it as soon as you remember. Take the next pill at the regular time.  If you miss 2 or more pills, call your health care provider because different pills have different instructions for missed doses. Use backup birth control until your next menstrual period starts.  If you use a 28-day pack that contains inactive pills and you miss 1 of the last 7 pills (pills with no hormones), it will not matter. Throw away the rest of the non-hormone pills and start a new pill pack.  No matter which day you start the OCP, you will always start a new pack on that same day of the week. Have an extra pack of OCPs and a backup contraceptive method available in case you miss some pills or lose your OCP pack. Follow these instructions at home:  Do not smoke.  Always use a condom to protect against STDs. OCPs do not protect against STDs.  Use a calendar to mark your menstrual period days.  Read the information and directions that came with your OCP. Talk to your health care provider if you have questions. Contact a health care provider if:  You develop nausea and vomiting.  You have abnormal vaginal discharge or bleeding.  You develop a rash.  You miss your menstrual period.  You are losing your hair.  You need treatment for mood swings or depression.  You  get dizzy when taking the OCP.  You develop acne from taking the OCP.  You become pregnant. Get help right away if:  You develop chest pain.  You develop shortness of breath.  You have an uncontrolled or severe headache.  You develop numbness or slurred speech.  You develop visual problems.  You develop pain, redness, and swelling in the legs. This information is not intended to replace advice given to you by your health care provider. Make sure you discuss any questions you have with your health care provider. Document Released: 06/26/2011 Document  Revised: 12/13/2015 Document Reviewed: 12/26/2012 Elsevier Interactive Patient Education  2017 [employer] Health Maintenance, Female Adopting a healthy lifestyle and getting preventive care can go a long way to promote health and wellness. Talk with your health care provider about what schedule of regular examinations is right for you. This is a good chance for you to check in with your provider about disease prevention and staying healthy. In between checkups, there are plenty of things you can do on your own. Experts have done a lot of research about which lifestyle changes and preventive measures are most likely to keep you healthy. Ask your health care provider for more information. Weight and diet Eat a healthy diet  Be sure to include plenty of vegetables, fruits, low-fat dairy products, and lean protein.  Do not eat a lot of foods high in solid fats, added sugars, or salt.  Get regular exercise. This is one of the most important things you can do for your health. ? Most adults should exercise for at least 150 minutes each week. The exercise should increase your heart rate and make you sweat (moderate-intensity exercise). ? Most adults should also do strengthening exercises at least twice a week. This is in addition to the moderate-intensity exercise.  Maintain a healthy weight  Body mass index (BMI) is a measurement that can be used to identify possible weight problems. It estimates body fat based on height and weight. Your health care provider can help determine your BMI and help you achieve or maintain a healthy weight.  For females 20 years of age and older: ? A BMI below 18.5 is considered underweight. ? A BMI of 18.5 to 24.9 is normal. ? A BMI of 25 to 29.9 is considered overweight. ? A BMI of 30 and above is considered obese.  Watch levels of cholesterol and blood lipids  You should start having your blood tested for lipids and cholesterol at 22 years of age, then have  this test every 5 years.  You may need to have your cholesterol levels checked more often if: ? Your lipid or cholesterol levels are high. ? You are older than 22 years of age. ? You are at high risk for heart disease.  Cancer screening Lung Cancer  Lung cancer screening is recommended for adults 55-80 years old who are at high risk for lung cancer because of a history of smoking.  A yearly low-dose CT scan of the lungs is recommended for people who: ? Currently smoke. ? Have quit within the past 15 years. ? Have at least a 30-pack-year history of smoking. A pack year is smoking an average of one pack of cigarettes a day for 1 year.  Yearly screening should continue until it has been 15 years since you quit.  Yearly screening should stop if you develop a health problem that would prevent you from having lung cancer treatment.  Breast Cancer  Practice breast   self-awareness. This means understanding how your breasts normally appear and feel.  It also means doing regular breast self-exams. Let your health care provider know about any changes, no matter how small.  If you are in your 20s or 30s, you should have a clinical breast exam (CBE) by a health care provider every 1-3 years as part of a regular health exam.  If you are 40 or older, have a CBE every year. Also consider having a breast X-ray (mammogram) every year.  If you have a family history of breast cancer, talk to your health care provider about genetic screening.  If you are at high risk for breast cancer, talk to your health care provider about having an MRI and a mammogram every year.  Breast cancer gene (BRCA) assessment is recommended for women who have family members with BRCA-related cancers. BRCA-related cancers include: ? Breast. ? Ovarian. ? Tubal. ? Peritoneal cancers.  Results of the assessment will determine the need for genetic counseling and BRCA1 and BRCA2 testing.  Cervical Cancer Your health care  provider may recommend that you be screened regularly for cancer of the pelvic organs (ovaries, uterus, and vagina). This screening involves a pelvic examination, including checking for microscopic changes to the surface of your cervix (Pap test). You may be encouraged to have this screening done every 3 years, beginning at age 21.  For women ages 30-65, health care providers may recommend pelvic exams and Pap testing every 3 years, or they may recommend the Pap and pelvic exam, combined with testing for human papilloma virus (HPV), every 5 years. Some types of HPV increase your risk of cervical cancer. Testing for HPV may also be done on women of any age with unclear Pap test results.  Other health care providers may not recommend any screening for nonpregnant women who are considered low risk for pelvic cancer and who do not have symptoms. Ask your health care provider if a screening pelvic exam is right for you.  If you have had past treatment for cervical cancer or a condition that could lead to cancer, you need Pap tests and screening for cancer for at least 20 years after your treatment. If Pap tests have been discontinued, your risk factors (such as having a new sexual partner) need to be reassessed to determine if screening should resume. Some women have medical problems that increase the chance of getting cervical cancer. In these cases, your health care provider may recommend more frequent screening and Pap tests.  Colorectal Cancer  This type of cancer can be detected and often prevented.  Routine colorectal cancer screening usually begins at 22 years of age and continues through 22 years of age.  Your health care provider may recommend screening at an earlier age if you have risk factors for colon cancer.  Your health care provider may also recommend using home test kits to check for hidden blood in the stool.  A small camera at the end of a tube can be used to examine your colon  directly (sigmoidoscopy or colonoscopy). This is done to check for the earliest forms of colorectal cancer.  Routine screening usually begins at age 50.  Direct examination of the colon should be repeated every 5-10 years through 22 years of age. However, you may need to be screened more often if early forms of precancerous polyps or small growths are found.  Skin Cancer  Check your skin from head to toe regularly.  Tell your health care provider about   any new moles or changes in moles, especially if there is a change in a mole's shape or color.  Also tell your health care provider if you have a mole that is larger than the size of a pencil eraser.  Always use sunscreen. Apply sunscreen liberally and repeatedly throughout the day.  Protect yourself by wearing long sleeves, pants, a wide-brimmed hat, and sunglasses whenever you are outside.  Heart disease, diabetes, and high blood pressure  High blood pressure causes heart disease and increases the risk of stroke. High blood pressure is more likely to develop in: ? People who have blood pressure in the high end of the normal range (130-139/85-89 mm Hg). ? People who are overweight or obese. ? People who are African American.  If you are 18-39 years of age, have your blood pressure checked every 3-5 years. If you are 40 years of age or older, have your blood pressure checked every year. You should have your blood pressure measured twice-once when you are at a hospital or clinic, and once when you are not at a hospital or clinic. Record the average of the two measurements. To check your blood pressure when you are not at a hospital or clinic, you can use: ? An automated blood pressure machine at a pharmacy. ? A home blood pressure monitor.  If you are between 55 years and 79 years old, ask your health care provider if you should take aspirin to prevent strokes.  Have regular diabetes screenings. This involves taking a blood sample to  check your fasting blood sugar level. ? If you are at a normal weight and have a low risk for diabetes, have this test once every three years after 22 years of age. ? If you are overweight and have a high risk for diabetes, consider being tested at a younger age or more often. Preventing infection Hepatitis B  If you have a higher risk for hepatitis B, you should be screened for this virus. You are considered at high risk for hepatitis B if: ? You were born in a country where hepatitis B is common. Ask your health care provider which countries are considered high risk. ? Your parents were born in a high-risk country, and you have not been immunized against hepatitis B (hepatitis B vaccine). ? You have HIV or AIDS. ? You use needles to inject street drugs. ? You live with someone who has hepatitis B. ? You have had sex with someone who has hepatitis B. ? You get hemodialysis treatment. ? You take certain medicines for conditions, including cancer, organ transplantation, and autoimmune conditions.  Hepatitis C  Blood testing is recommended for: ? Everyone born from 1945 through 1965. ? Anyone with known risk factors for hepatitis C.  Sexually transmitted infections (STIs)  You should be screened for sexually transmitted infections (STIs) including gonorrhea and chlamydia if: ? You are sexually active and are younger than 22 years of age. ? You are older than 22 years of age and your health care provider tells you that you are at risk for this type of infection. ? Your sexual activity has changed since you were last screened and you are at an increased risk for chlamydia or gonorrhea. Ask your health care provider if you are at risk.  If you do not have HIV, but are at risk, it may be recommended that you take a prescription medicine daily to prevent HIV infection. This is called pre-exposure prophylaxis (PrEP). You are considered at   risk if: ? You are sexually active and do not regularly  use condoms or know the HIV status of your partner(s). ? You take drugs by injection. ? You are sexually active with a partner who has HIV.  Talk with your health care provider about whether you are at high risk of being infected with HIV. If you choose to begin PrEP, you should first be tested for HIV. You should then be tested every 3 months for as long as you are taking PrEP. Pregnancy  If you are premenopausal and you may become pregnant, ask your health care provider about preconception counseling.  If you may become pregnant, take 400 to 800 micrograms (mcg) of folic acid every day.  If you want to prevent pregnancy, talk to your health care provider about birth control (contraception). Osteoporosis and menopause  Osteoporosis is a disease in which the bones lose minerals and strength with aging. This can result in serious bone fractures. Your risk for osteoporosis can be identified using a bone density scan.  If you are 65 years of age or older, or if you are at risk for osteoporosis and fractures, ask your health care provider if you should be screened.  Ask your health care provider whether you should take a calcium or vitamin D supplement to lower your risk for osteoporosis.  Menopause may have certain physical symptoms and risks.  Hormone replacement therapy may reduce some of these symptoms and risks. Talk to your health care provider about whether hormone replacement therapy is right for you. Follow these instructions at home:  Schedule regular health, dental, and eye exams.  Stay current with your immunizations.  Do not use any tobacco products including cigarettes, chewing tobacco, or electronic cigarettes.  If you are pregnant, do not drink alcohol.  If you are breastfeeding, limit how much and how often you drink alcohol.  Limit alcohol intake to no more than 1 drink per day for nonpregnant women. One drink equals 12 ounces of beer, 5 ounces of wine, or 1 ounces  of hard liquor.  Do not use street drugs.  Do not share needles.  Ask your health care provider for help if you need support or information about quitting drugs.  Tell your health care provider if you often feel depressed.  Tell your health care provider if you have ever been abused or do not feel safe at home. This information is not intended to replace advice given to you by your health care provider. Make sure you discuss any questions you have with your health care provider. Document Released: 01/20/2011 Document Revised: 12/13/2015 Document Reviewed: 04/10/2015 Elsevier Interactive Patient Education  2018 [employer] Inc[employer]  

## 2017-03-17 ENCOUNTER — Other Ambulatory Visit (HOSPITAL_COMMUNITY)
Admission: RE | Admit: 2017-03-17 | Discharge: 2017-03-17 | Disposition: A | Discharge status: Home / Self Care | Payer: BLUE CROSS/BLUE SHIELD | Source: Ambulatory Visit | Attending: Obstetrics and Gynecology | Admitting: Obstetrics and Gynecology

## 2017-03-17 ENCOUNTER — Ambulatory Visit (INDEPENDENT_AMBULATORY_CARE_PROVIDER_SITE_OTHER): Payer: BLUE CROSS/BLUE SHIELD | Admitting: Obstetrics and Gynecology

## 2017-03-17 ENCOUNTER — Encounter: Payer: Self-pay | Admitting: Obstetrics and Gynecology

## 2017-03-17 VITALS — BP 122/80 | HR 81 | Ht 66.0 in | Wt 208.0 lb

## 2017-03-17 DIAGNOSIS — B373 Candidiasis of vulva and vagina: Secondary | ICD-10-CM | POA: Diagnosis not present

## 2017-03-17 DIAGNOSIS — N898 Other specified noninflammatory disorders of vagina: Secondary | ICD-10-CM | POA: Insufficient documentation

## 2017-03-17 DIAGNOSIS — Z30017 Encounter for initial prescription of implantable subdermal contraceptive: Secondary | ICD-10-CM

## 2017-03-17 DIAGNOSIS — Z3202 Encounter for pregnancy test, result negative: Secondary | ICD-10-CM

## 2017-03-17 DIAGNOSIS — Z308 Encounter for other contraceptive management: Secondary | ICD-10-CM

## 2017-03-17 LAB — POCT URINE PREGNANCY: PREG TEST UR: NEGATIVE

## 2017-03-17 NOTE — Assessment & Plan Note (Signed)
Repeat pap smear 1/19

## 2017-03-17 NOTE — Patient Instructions (Signed)
Nexplanon Instructions After Insertion  Keep bandage clean and dry for 24 hours  May use ice/Tylenol/Ibuprofen for soreness or pain  If you develop fever, drainage or increased warmth from incision site-contact office immediately   

## 2017-03-17 NOTE — Addendum Note (Signed)
Addended by: Hamilton Capri on: 03/17/2017 04:52 PM   Modules accepted: Orders

## 2017-03-17 NOTE — Progress Notes (Signed)
Pt states that she has been sexually active. Last IC was about 2 weeks ago with condom. Pt states that she has had unprotected IC about a month ago. Test today is negative.  Sabrina Coleman is a 21 y.o. [phone] here for  Nexplanon insertion.    Nexplanon Insertion Procedure Patient identified, informed consent performed, consent signed.   Patient does understand that irregular bleeding is a very common side effect of this medication. She was advised to have backup contraception for one week after placement. Pregnancy test in clinic today was negative.  Appropriate time out taken.  Patient's left arm was prepped and draped in the usual sterile fashion.. The ruler used to measure and mark insertion area.  Patient was prepped with alcohol swab and then injected with 3 ml of 1% lidocaine.  She was prepped with betadine, Nexplanon removed from packaging,  Device confirmed in needle, then inserted full length of needle and withdrawn per handbook instructions. Nexplanon was able to palpated in the patient's arm; patient palpated the insert herself. There was minimal blood loss.  Patient insertion site covered with guaze and a pressure bandage to reduce any bruising.  The patient tolerated the procedure well and was given post procedure instructions.    Hermina Staggers, MD, FACOG Attending Obstetrician & Gynecologist Center for [facility], [facility]

## 2017-03-18 ENCOUNTER — Telehealth: Payer: Self-pay

## 2017-03-18 DIAGNOSIS — Z308 Encounter for other contraceptive management: Secondary | ICD-10-CM | POA: Diagnosis not present

## 2017-03-18 DIAGNOSIS — B379 Candidiasis, unspecified: Secondary | ICD-10-CM

## 2017-03-18 LAB — CERVICOVAGINAL ANCILLARY ONLY
Bacterial vaginitis: NEGATIVE
Candida vaginitis: POSITIVE — AB

## 2017-03-18 MED ORDER — ETONOGESTREL 68 MG ~~LOC~~ IMPL
68.0000 mg | DRUG_IMPLANT | Freq: Once | SUBCUTANEOUS | Status: AC
  Administered 2017-03-18: 68 mg via SUBCUTANEOUS

## 2017-03-18 MED ORDER — FLUCONAZOLE 150 MG PO TABS: 150.0000 mg | ORAL_TABLET | Freq: Every day | ORAL | 0 refills | Status: DC

## 2017-03-18 NOTE — Telephone Encounter (Signed)
Contacted pt and advised of results and rx prescribed by provider. 

## 2017-03-18 NOTE — Addendum Note (Signed)
Addended by: Dalphine HandingGARDNER, Karia Ehresman L on: 03/18/2017 08:20 AM   Modules accepted: Orders

## 2017-03-26 ENCOUNTER — Encounter: Payer: Self-pay | Admitting: Obstetrics and Gynecology

## 2017-04-14 ENCOUNTER — Ambulatory Visit: Payer: BLUE CROSS/BLUE SHIELD | Admitting: Obstetrics and Gynecology

## 2017-05-03 ENCOUNTER — Emergency Department (HOSPITAL_COMMUNITY)
Admission: EM | Admit: 2017-05-03 | Discharge: 2017-05-03 | Disposition: A | Discharge status: Home / Self Care | Payer: BLUE CROSS/BLUE SHIELD | Attending: Emergency Medicine | Admitting: Emergency Medicine

## 2017-05-03 ENCOUNTER — Encounter (HOSPITAL_COMMUNITY): Payer: Self-pay

## 2017-05-03 DIAGNOSIS — R0602 Shortness of breath: Secondary | ICD-10-CM | POA: Diagnosis present

## 2017-05-03 DIAGNOSIS — R55 Syncope and collapse: Secondary | ICD-10-CM | POA: Diagnosis not present

## 2017-05-03 DIAGNOSIS — F17210 Nicotine dependence, cigarettes, uncomplicated: Secondary | ICD-10-CM | POA: Diagnosis not present

## 2017-05-03 HISTORY — DX: Essential (primary) hypertension: I10

## 2017-05-03 LAB — CBG MONITORING, ED
GLUCOSE-CAPILLARY: 82 mg/dL (ref 65–99)
GLUCOSE-CAPILLARY: 87 mg/dL (ref 65–99)

## 2017-05-03 LAB — BASIC METABOLIC PANEL
Anion gap: 6 (ref 5–15)
BUN: 11 mg/dL (ref 6–20)
CHLORIDE: 106 mmol/L (ref 101–111)
CO2: 27 mmol/L (ref 22–32)
CREATININE: 0.82 mg/dL (ref 0.44–1.00)
Calcium: 9.2 mg/dL (ref 8.9–10.3)
GFR calc non Af Amer: >60 mL/min (ref >60)
Glucose, Bld: 100 mg/dL — ABNORMAL HIGH (ref 65–99)
POTASSIUM: 3.9 mmol/L (ref 3.5–5.1)
Sodium: 139 mmol/L (ref 135–145)

## 2017-05-03 LAB — URINALYSIS, ROUTINE W REFLEX MICROSCOPIC
BACTERIA UA: NONE SEEN
BILIRUBIN URINE: NEGATIVE
Glucose, UA: NEGATIVE mg/dL
Hgb urine dipstick: NEGATIVE
KETONES UR: NEGATIVE mg/dL
Nitrite: NEGATIVE
PROTEIN: NEGATIVE mg/dL
Specific Gravity, Urine: 1.024 (ref 1.005–1.030)
pH: 6 (ref 5.0–8.0)

## 2017-05-03 LAB — CBC
HEMATOCRIT: 37.6 % (ref 36.0–46.0)
Hemoglobin: 12.3 g/dL (ref 12.0–15.0)
MCH: 30.5 pg (ref 26.0–34.0)
MCHC: 32.7 g/dL (ref 30.0–36.0)
MCV: 93.3 fL (ref 78.0–100.0)
PLATELETS: 297 10*3/uL (ref 150–400)
RBC: 4.03 MIL/uL (ref 3.87–5.11)
RDW: 13.2 % (ref 11.5–15.5)
WBC: 6.8 10*3/uL (ref 4.0–10.5)

## 2017-05-03 LAB — PREGNANCY, URINE: PREG TEST UR: NEGATIVE

## 2017-05-03 MED ORDER — SODIUM CHLORIDE 0.9 % IV BOLUS (SEPSIS): 1000.0000 mL | Freq: Once | INTRAVENOUS | Status: DC

## 2017-05-03 NOTE — ED Triage Notes (Signed)
Patient states she was going outside to smoke and felt hot once outside. Patient states she passed out for approx 4 minutes. Patient states she felt SOB afterwards, but no SOB now. Patient states she feels sleepy and tired now. Patient denies hitting her head. Patient's mother states patient was "glassy-eyed and did not answer when talked to." once she arrived 10 minutes later.

## 2017-05-03 NOTE — ED Notes (Signed)
Pt given cranberry juice and cheese sticks.

## 2017-05-03 NOTE — ED Notes (Addendum)
Lab contacted regarding no blood results.  Tubes were located and are being run now.

## 2017-05-03 NOTE — ED Notes (Signed)
Pt refused IV and IV fluids stating that she wants to go home and that she will drink PO fluids.  Pt denies nausea.  Dr. Shela Commons notified and will see Pt.

## 2017-05-03 NOTE — ED Provider Notes (Deleted)
Patient had syncopal event this morning while lighting up a cigar. She felt hot all over a few seconds before using consciousness and upon regaining consciousness felt normal. She is presently alert and in no distress asymptomatic. On exam no distress last to come score 15 HEENT exam no facial asymmetry neck supple heart regular rate and rhythm no murmurs or rubs abdomen nondistended nontender extremities without edema. Neurologic Glasgow Coma Score 15 gait normal cranial nerves II through XII grossly intact. Results for orders placed or performed during the hospital encounter of 05/03/17  Basic metabolic panel  Result Value Ref Range   Sodium 139 135 - 145 mmol/L   Potassium 3.9 3.5 - 5.1 mmol/L   Chloride 106 101 - 111 mmol/L   CO2 27 22 - 32 mmol/L   Glucose, Bld 100 (H) 65 - 99 mg/dL   BUN 11 6 - 20 mg/dL   Creatinine, Ser 1.61 0.44 - 1.00 mg/dL   Calcium 9.2 8.9 - 09.6 mg/dL   GFR calc non Af Amer >60 >60 mL/min   GFR calc Af Amer >60 >60 mL/min   Anion gap 6 5 - 15  CBC  Result Value Ref Range   WBC 6.8 4.0 - 10.5 K/uL   RBC 4.03 3.87 - 5.11 MIL/uL   Hemoglobin 12.3 12.0 - 15.0 g/dL   HCT 04.5 40.9 - 81.1 %   MCV 93.3 78.0 - 100.0 fL   MCH 30.5 26.0 - 34.0 pg   MCHC 32.7 30.0 - 36.0 g/dL   RDW 91.4 78.2 - 95.6 %   Platelets 297 150 - 400 K/uL  Urinalysis, Routine w reflex microscopic  Result Value Ref Range   Color, Urine YELLOW YELLOW   APPearance HAZY (A) CLEAR   Specific Gravity, Urine 1.024 1.005 - 1.030   pH 6.0 5.0 - 8.0   Glucose, UA NEGATIVE NEGATIVE mg/dL   Hgb urine dipstick NEGATIVE NEGATIVE   Bilirubin Urine NEGATIVE NEGATIVE   Ketones, ur NEGATIVE NEGATIVE mg/dL   Protein, ur NEGATIVE NEGATIVE mg/dL   Nitrite NEGATIVE NEGATIVE   Leukocytes, UA TRACE (A) NEGATIVE   RBC / HPF 0-5 0 - 5 RBC/hpf   WBC, UA 0-5 0 - 5 WBC/hpf   Bacteria, UA NONE SEEN NONE SEEN   Squamous Epithelial / LPF 6-30 (A) NONE SEEN   Mucus PRESENT   Pregnancy, urine  Result Value Ref  Range   Preg Test, Ur NEGATIVE NEGATIVE  CBG monitoring, ED  Result Value Ref Range   Glucose-Capillary 82 65 - 99 mg/dL  CBG monitoring, ED  Result Value Ref Range   Glucose-Capillary 87 65 - 99 mg/dL   No results found.   Suspect vasovagal response. Plan referral primary care.I counseled patient for 5 minutes on smoking cessation Diagnosis syncope   Doug Sou, MD 05/03/17 1506

## 2017-05-03 NOTE — Discharge Instructions (Signed)
Take your medications as prescribed.call the number on these discharge instructions tomorrow to get a primary care physician. Ask your new primary care physician to help you to stop smoking. Return if concern for any reason

## 2017-05-03 NOTE — ED Provider Notes (Signed)
[facility]-EMERGENCY DEPT Provider Note   CSN: [id number] Arrival date & time: 05/03/17  1013     History   Chief Complaint Chief Complaint  Patient presents with  . Loss of Consciousness  . Shortness of Breath  patient had syncopal event this morning while lighting a cigar. She felt hot all over for a few seconds before losing consciousness and upon regaining consciousness felt normal. She denies any shortness of breath denies chest pain denies headache no treatment prior to coming here. No other associated symptoms  HPI Sabrina Coleman is a 21 y.o. female.  HPI  Past Medical History:  Diagnosis Date  . Chlamydia   . Hypertension   . Infection    UTI    Patient Active Problem List   Diagnosis Date Noted  . Contraception management 01/14/2017  . Mild tetrahydrocannabinol (THC) abuse 08/14/2016  . Abnormal Pap smear of cervix 08/12/2016  . BMI 35.0-35.9,adult 11/15/2013   5 months postpartum Past Surgical History:  Procedure Laterality Date  . CESAREAN SECTION  11/30/2016   Procedure: CESAREAN SECTION;  Surgeon: Adam Phenix, MD;  Location: [facility] BIRTHING SUITES;  Service: Obstetrics;;  . INDUCED ABORTION    . WISDOM TOOTH EXTRACTION      OB History    Gravida Para Term Preterm AB Living   SAB TAB Ectopic Multiple Live Births     1   0 1       Home Medications    Prior to Admission medications   Medication Sig Start Date End Date Taking? Authorizing Provider  etonogestrel (NEXPLANON) 68 MG IMPL implant 68 mg by Subdermal route once.   Yes [provider]  fluconazole (DIFLUCAN) 150 MG tablet Take 1 tablet (150 mg total) by mouth daily. Take 1 tab by mouth now and repeat in 3 days. Patient not taking: Reported on 05/03/2017 03/18/17   Hermina Staggers, MD   Blood pressure medication which patient does not recall Family History Family History  Problem Relation Age of Onset  . Asthma Brother   . Cancer Maternal Grandmother   . Asthma  Brother     Social History Social History  Substance Use Topics  . Smoking status: Current Every Day Smoker    Types: Cigars  . Smokeless tobacco: Never Used  . Alcohol use No   Former user of marijuanano longer uses marijuana or illicit drugs  Allergies   Patient has no known allergies.   Review of Systems Review of Systems  Constitutional: Negative.   HENT: Negative.   Respiratory: Negative.   Cardiovascular: Negative.   Gastrointestinal: Negative.   Musculoskeletal: Negative.   Skin: Negative.   Neurological: Negative.   Psychiatric/Behavioral: Negative.   All other systems reviewed and are negative.    Physical Exam Updated Vital Signs BP (!) 146/87 (BP Location: Right Arm)   Pulse (!) 58   Temp 98.6 F (37 C) (Oral)   Resp 16   Ht  (1.702 m)   LMP 04/20/2017   SpO2 100%   Physical Exam  Constitutional: She is oriented to person, place, and time. She appears well-developed and well-nourished.  HENT:  Head: Normocephalic and atraumatic.  Eyes: Pupils are equal, round, and reactive to light. Conjunctivae are normal.  Neck: Neck supple. No tracheal deviation present. No thyromegaly present.  Cardiovascular: Normal rate and regular rhythm.   No murmur heard. Pulmonary/Chest: Effort normal and breath sounds normal.  Abdominal: Soft. Bowel sounds  are normal. She exhibits no distension. There is no tenderness.  Musculoskeletal: Normal range of motion. She exhibits no edema or tenderness.  Neurological: She is alert and oriented to person, place, and time. Coordination normal.  Gait normal not lightheaded on standing.  Skin: Skin is warm and dry. No rash noted.  Psychiatric: She has a normal mood and affect.  Nursing note and vitals reviewed.    ED Treatments / Results  Labs (all labs ordered are listed, but only abnormal results are displayed) Labs Reviewed  BASIC METABOLIC PANEL - Abnormal; Notable for the following:       Result Value   Glucose,  Bld 100 (*)    All other components within normal limits  URINALYSIS, ROUTINE W REFLEX MICROSCOPIC - Abnormal; Notable for the following:    APPearance HAZY (*)    Leukocytes, UA TRACE (*)    Squamous Epithelial / LPF 6-30 (*)    All other components within normal limits  CBC  PREGNANCY, URINE  CBG MONITORING, ED  CBG MONITORING, ED    EKG  EKG Interpretation  Date/Time:  Sunday May 03 2017 10:27:21 EDT Ventricular Rate:  72 PR Interval:    QRS Duration: 83 QT Interval:  384 QTC Calculation: 421 R Axis:   84 Text Interpretation:  Sinus arrhythmia ST elev, probable normal early repol pattern No old tracing to compare Confirmed by [place], Doreatha Martin [phone]) on 05/03/2017 2:57:46 PM       Radiology No results found.  Procedures Procedures (including critical care time)  Medications Ordered in ED Medications  sodium chloride 0.9 % bolus 1,000 mL (1,000 mLs Intravenous Not Given 05/03/17 1458)     Initial Impression / Assessment and Plan / ED Course  I have reviewed the triage vital signs and the nursing notes.  Pertinent labs & imaging results that were available during my care of the patient were reviewed by me and considered in my medical decision making (see chart for details).     Syncope felt to be vasovagal in etiology. I counseled patient for 5 minutes on smoking cessation. She is referred to primary care. She is advised to resume her usual medications including blood pressure medication that she does not recall  Final Clinical Impressions(s) / ED Diagnoses  Diagnosis syncope Final diagnoses:  Syncope and collapse    New Prescriptions New Prescriptions   No medications on file     Doug Sou, MD 05/03/17 1513

## 2018-04-01 ENCOUNTER — Ambulatory Visit (INDEPENDENT_AMBULATORY_CARE_PROVIDER_SITE_OTHER): Payer: BLUE CROSS/BLUE SHIELD | Admitting: Physician Assistant

## 2018-04-01 ENCOUNTER — Other Ambulatory Visit: Payer: Self-pay

## 2018-04-01 ENCOUNTER — Encounter: Payer: Self-pay | Admitting: Physician Assistant

## 2018-04-01 VITALS — BP 112/70 | HR 89 | Temp 99.0°F | Resp 16 | Ht 66.5 in | Wt 208.6 lb

## 2018-04-01 DIAGNOSIS — Z01818 Encounter for other preprocedural examination: Secondary | ICD-10-CM

## 2018-04-01 NOTE — Progress Notes (Signed)
Sabrina Coleman  MRN: [id number] DOB: 04/12/1996  Subjective:  Sabrina Coleman is a 22 y.o. G2P1 female seen in office today for a chief complaint of preoperative evaluation. Pt is scheduled for cosmetic surgery on 04/22/18 with the [facility] in [place], [place]. Will be undergoing general anesthesia.  Has had c-section in the past and tolerated well.  Chronic medical conditions: N/A.  Cardiac status: Did have HTN during pregnancy. No PMH of MI, CHF, CAD, arrhythmia, PVD. Pulmonary status: No PMH asthma, COPD, OSA. Former marijuana user. Stopped 03/04/18. Has never used tobacco products.  Other: PMH of obesity, BMI is 33.  No PMH CKD, DM, CVA, TIA, DVT, PE.  Functional status:  Exercises at least 2 days per week 30 minutes. Can participate in strenuous sports such as running, swimming, singles tennis, football, basketball, and skiing (>10 METs).  Child bearing age: Yes, has nexplanon in for contraception.   Review of Systems  Constitutional: Negative for activity change, appetite change, chills, diaphoresis, fatigue, fever and unexpected weight change.  HENT: Negative for congestion, dental problem, drooling, ear discharge, ear pain, facial swelling, hearing loss, mouth sores, nosebleeds, postnasal drip, rhinorrhea, sinus pressure, sinus pain, sneezing, sore throat, tinnitus, trouble swallowing and voice change.   Eyes: Negative for photophobia, pain, discharge, redness, itching and visual disturbance.  Respiratory: Negative for apnea, cough, choking, chest tightness, shortness of breath, wheezing and stridor.   Cardiovascular: Negative for chest pain, palpitations and leg swelling.  Gastrointestinal: Negative for abdominal distention, abdominal pain, anal bleeding, blood in stool, constipation, diarrhea, nausea, rectal pain and vomiting.  Endocrine: Negative for cold intolerance, heat intolerance, polydipsia, polyphagia and polyuria.  Genitourinary: Negative for decreased urine volume,  difficulty urinating, dyspareunia, dysuria, enuresis, flank pain, frequency, genital sores, hematuria, menstrual problem, pelvic pain, urgency, vaginal bleeding, vaginal discharge and vaginal pain.  Musculoskeletal: Negative for arthralgias, back pain, gait problem, joint swelling, myalgias, neck pain and neck stiffness.  Skin: Negative for color change, pallor, rash and wound.  Allergic/Immunologic: Negative for environmental allergies, food allergies and immunocompromised state.  Neurological: Negative for dizziness, tremors, seizures, syncope, facial asymmetry, speech difficulty, weakness, light-headedness, numbness and headaches.  Hematological: Negative for adenopathy. Does not bruise/bleed easily.  Psychiatric/Behavioral: Negative for agitation, behavioral problems, confusion, decreased concentration, dysphoric mood, hallucinations, self-injury, sleep disturbance and suicidal ideas. The patient is not nervous/anxious and is not hyperactive.     Patient Active Problem List   Diagnosis Date Noted  . Contraception management 01/14/2017  . Mild tetrahydrocannabinol (THC) abuse 08/14/2016  . Abnormal Pap smear of cervix 08/12/2016    Current Outpatient Medications on File Prior to Visit  Medication Sig Dispense Refill  . etonogestrel (NEXPLANON) 68 MG IMPL implant 68 mg by Subdermal route once.     No current facility-administered medications on file prior to visit.     No Known Allergies    Social History   Socioeconomic History  . Marital status: Single    Spouse name: Not on file  . Number of children: 1  . Years of education: Not on file  . Highest education level: Not on file  Occupational History  . Not on file  Social Needs  . Financial resource strain: Not on file  . Food insecurity:    Worry: Not on file    Inability: Not on file  . Transportation needs:    Medical: Not on file    Non-medical: Not on file  Tobacco Use  . Smoking status: Former Smoker  Types:  Cigars  . Smokeless tobacco: Never Used  Substance and Sexual Activity  . Alcohol use: No  . Drug use: No    Comment: no longer  . Sexual activity: Not Currently    Birth control/protection: None  Lifestyle  . Physical activity:    Days per week: 2 days    Minutes per session: 30 min  . Stress: Not on file  Relationships  . Social connections:    Talks on phone: Not on file    Gets together: Not on file    Attends religious service: Not on file    Active member of club or organization: Not on file    Attends meetings of clubs or organizations: Not on file    Relationship status: Not on file  . Intimate partner violence:    Fear of current or ex partner: Not on file    Emotionally abused: Not on file    Physically abused: Not on file    Forced sexual activity: Not on file  Other Topics Concern  . Not on file  Social History Narrative  . Not on file    Objective:  BP 112/70 (BP Location: Left Arm, Patient Position: Sitting, Cuff Size: Large)   Pulse 89   Temp 99 F (37.2 C) (Oral)   Resp 16   Ht 5' 6.5" (1.689 m)   Wt 208 lb 9.6 oz (94.6 kg)   LMP 03/14/2018   SpO2 99%   Breastfeeding? No   BMI 33.16 kg/m   Physical Exam  Constitutional: She is oriented to person, place, and time. She appears well-developed and well-nourished. No distress.  HENT:  Head: Normocephalic and atraumatic.  Right Ear: Hearing, tympanic membrane, external ear and ear canal normal.  Left Ear: Hearing, tympanic membrane, external ear and ear canal normal.  Nose: Nose normal.  Mouth/Throat: Uvula is midline, oropharynx is clear and moist and mucous membranes are normal. No oropharyngeal exudate.  Eyes: Pupils are equal, round, and reactive to light. Conjunctivae, EOM and lids are normal. No scleral icterus.  Neck: Trachea normal and normal range of motion. No thyroid mass and no thyromegaly present.  Cardiovascular: Normal rate, regular rhythm, normal heart sounds and intact distal pulses.    No murmur heard. Pulmonary/Chest: Effort normal and breath sounds normal. She has no decreased breath sounds. She has no wheezes. She has no rhonchi. She has no rales.  Abdominal: Soft. Normal appearance and bowel sounds are normal. There is no tenderness.  Musculoskeletal: Normal range of motion.  Lymphadenopathy:       Head (right side): No tonsillar, no preauricular, no posterior auricular and no occipital adenopathy present.       Head (left side): No tonsillar, no preauricular, no posterior auricular and no occipital adenopathy present.    She has no cervical adenopathy.       Right: No supraclavicular adenopathy present.       Left: No supraclavicular adenopathy present.  Neurological: She is alert and oriented to person, place, and time. She has normal strength and normal reflexes.  Skin: Skin is warm and dry.   EKG shows NSR with rate of 89bpm. PR and QRS intervals within normal limits. No acute ST or T wave abnormalities.  Assessment and Plan :  1. Preoperative clearance Pt is an overall healthy 23 yo female. No chronic medical conditions. Vitals stable. EKG normal. As long as labs are normal, pt will be cleared for surgery as she is low risk at this  time. Once lab return, will  fax results to [facility] [phone] - CBC with Differential/Platelet - CMP14+EGFR - PT AND PTT - hCG, serum, qualitative - HIV Antibody (routine testing w rflx) - EKG 12-Lead  A total of 25 minutes was spent in the room with the patient, greater than 50% of which was in counseling/coordination of care regarding preoperative clearance.  Tenna Delaine PA-C  Primary [facility] at [facility] 04/01/2018 3:50 PM

## 2018-04-01 NOTE — Patient Instructions (Addendum)
As long as your labs are normal, you are cleared for surgery.   If you have lab work done today you will be contacted with your lab results within the next 2 weeks.  If you have not heard from us then please contact us. The fastest way to get your results is to register for My Chart.   IF you received an x-ray today, you will receive an invoice from [facility]. Please contact [facility] at [phone] with questions or concerns regarding your invoice.   IF you received labwork today, you will receive an invoice from [facility]. Please contact [facility] at [phone] with questions or concerns regarding your invoice.   Our billing staff will not be able to assist you with questions regarding bills from these companies.  You will be contacted with the lab results as soon as they are available. The fastest way to get your results is to activate your My Chart account. Instructions are located on the last page of this paperwork. If you have not heard from us regarding the results in 2 weeks, please contact this office.

## 2018-04-03 LAB — CMP14+EGFR
A/G RATIO: 1.5 (ref 1.2–2.2)
ALT: 10 IU/L (ref 0–32)
AST: 13 IU/L (ref 0–40)
Albumin: 4 g/dL (ref 3.5–5.5)
Alkaline Phosphatase: 71 IU/L (ref 39–117)
BUN/Creatinine Ratio: 11 (ref 9–23)
BUN: 9 mg/dL (ref 6–20)
Bilirubin Total: 0.4 mg/dL (ref 0.0–1.2)
CALCIUM: 9.1 mg/dL (ref 8.7–10.2)
CO2: 23 mmol/L (ref 20–29)
Chloride: 104 mmol/L (ref 96–106)
Creatinine, Ser: 0.82 mg/dL (ref 0.57–1.00)
GFR calc Af Amer: 117 mL/min/{1.73_m2} (ref >59)
GFR, EST NON AFRICAN AMERICAN: 101 mL/min/{1.73_m2} (ref >59)
GLOBULIN, TOTAL: 2.7 g/dL (ref 1.5–4.5)
Glucose: 78 mg/dL (ref 65–99)
POTASSIUM: 3.8 mmol/L (ref 3.5–5.2)
SODIUM: 142 mmol/L (ref 134–144)
Total Protein: 6.7 g/dL (ref 6.0–8.5)

## 2018-04-03 LAB — CBC WITH DIFFERENTIAL/PLATELET
BASOS: 0 %
Basophils Absolute: 0 10*3/uL (ref 0.0–0.2)
EOS (ABSOLUTE): 0.1 10*3/uL (ref 0.0–0.4)
EOS: 1 %
HEMATOCRIT: 37.2 % (ref 34.0–46.6)
Hemoglobin: 12.2 g/dL (ref 11.1–15.9)
IMMATURE GRANULOCYTES: 0 %
Immature Grans (Abs): 0 10*3/uL (ref 0.0–0.1)
LYMPHS ABS: 2.2 10*3/uL (ref 0.7–3.1)
Lymphs: 32 %
MCH: 31.1 pg (ref 26.6–33.0)
MCHC: 32.8 g/dL (ref 31.5–35.7)
MCV: 95 fL (ref 79–97)
MONOS ABS: 0.5 10*3/uL (ref 0.1–0.9)
Monocytes: 8 %
Neutrophils Absolute: 4.1 10*3/uL (ref 1.4–7.0)
Neutrophils: 59 %
Platelets: 287 10*3/uL (ref 150–450)
RBC: 3.92 x10E6/uL (ref 3.77–5.28)
RDW: 11.8 % — AB (ref 12.3–15.4)
WBC: 6.9 10*3/uL (ref 3.4–10.8)

## 2018-04-03 LAB — PT AND PTT
APTT: 31 s (ref 24–33)
INR: 1 (ref 0.8–1.2)
Prothrombin Time: 10.5 s (ref 9.1–12.0)

## 2018-04-03 LAB — HIV ANTIBODY (ROUTINE TESTING W REFLEX): HIV SCREEN 4TH GENERATION: NONREACTIVE

## 2018-04-03 LAB — HCG, SERUM, QUALITATIVE: hCG,Beta Subunit,Qual,Serum: NEGATIVE m[IU]/mL (ref <6)

## 2018-04-05 ENCOUNTER — Telehealth: Payer: Self-pay | Admitting: Physician Assistant

## 2018-04-05 NOTE — Telephone Encounter (Signed)
Copied from CRM [id number]. Topic: Inquiry >> Apr 05, 2018  8:48 AM Lenoria ChimeBeasley, Denise S wrote: Reason for CRM: Pt called wanting Benjiman CoreBrittany Wiseman, PA to  fax [facility] [phone]

## 2018-04-06 NOTE — Telephone Encounter (Signed)
Faxed results.

## 2018-04-12 ENCOUNTER — Encounter: Payer: Self-pay | Admitting: Physician Assistant

## 2018-04-14 ENCOUNTER — Ambulatory Visit: Payer: Self-pay | Admitting: Emergency Medicine

## 2018-05-13 ENCOUNTER — Encounter: Payer: BLUE CROSS/BLUE SHIELD | Admitting: Physician Assistant

## 2018-07-06 IMAGING — US US MFM OB FOLLOW-UP
1 series · 14 of 28 positions shown · non-contrast
Comparison: none

[Series 1: us mfm ob follow-up · 58 acquisitions, 14 frames shown]
[im 3/58]
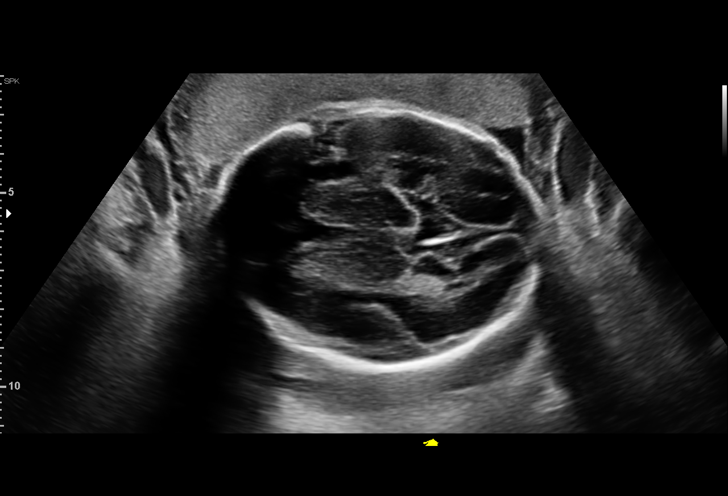
[im 7/58]
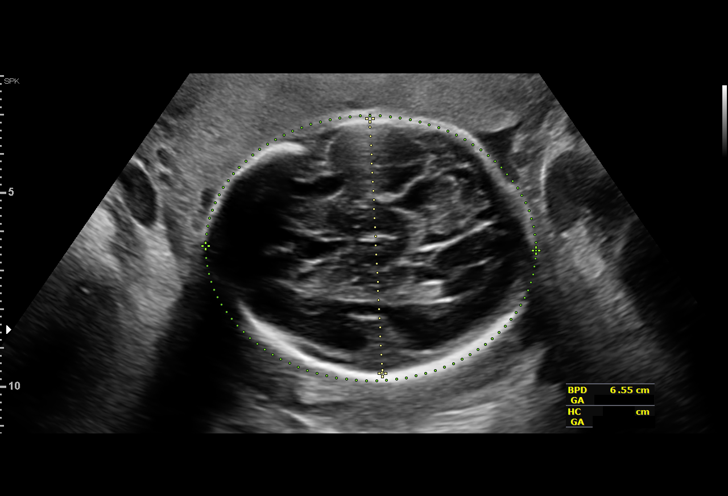
[im 11/58]
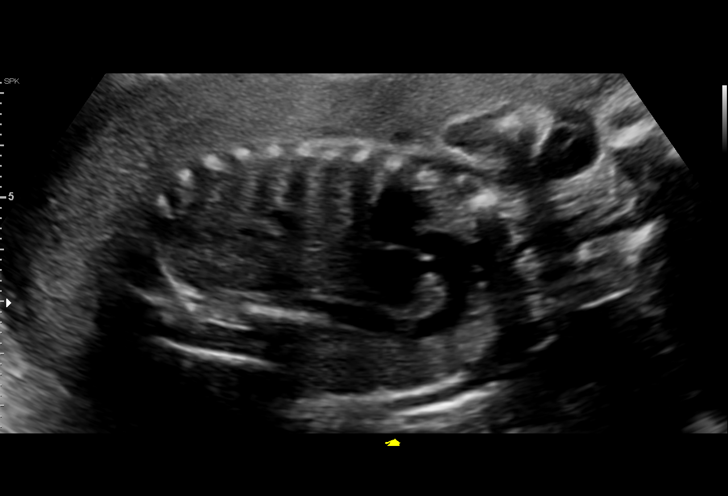
[im 15/58]
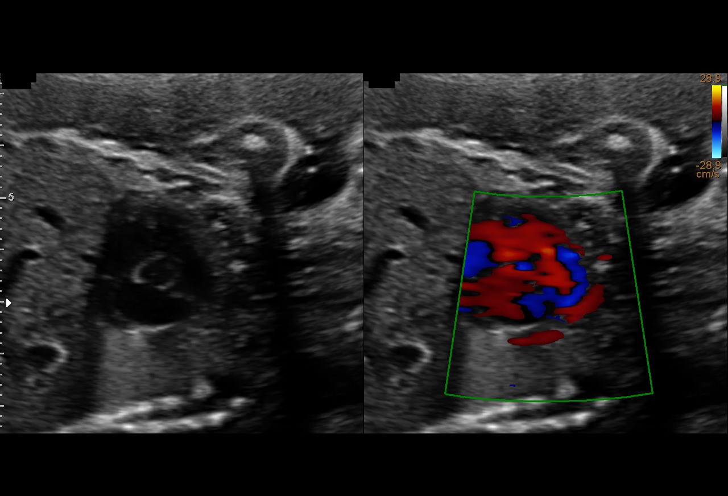
[im 20/58]
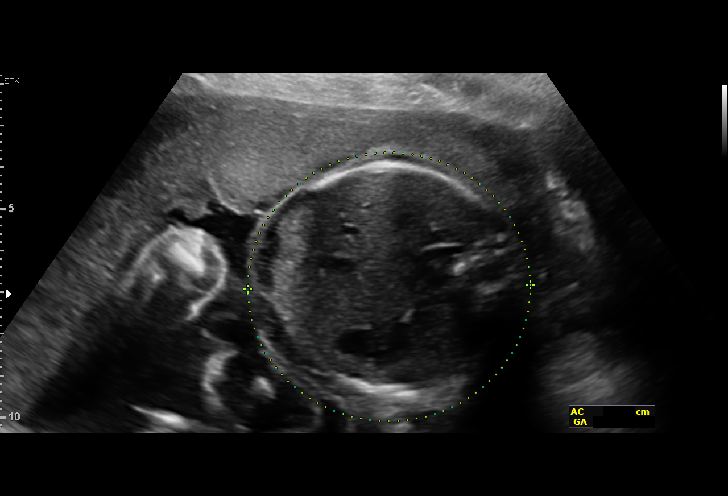
[im 24/58]
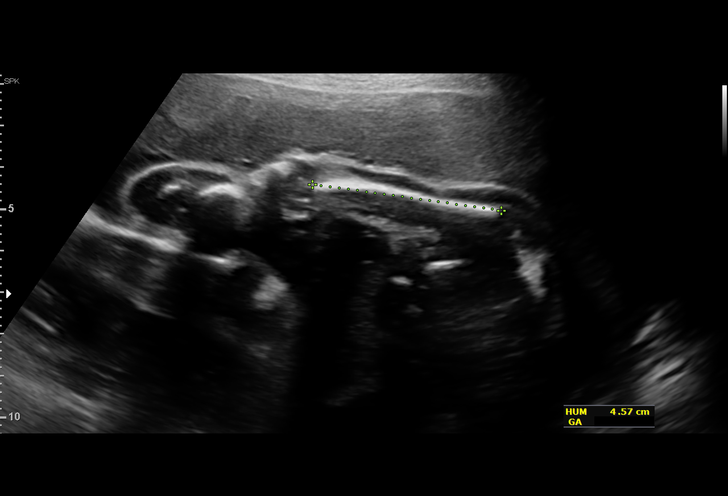
[im 28/58]
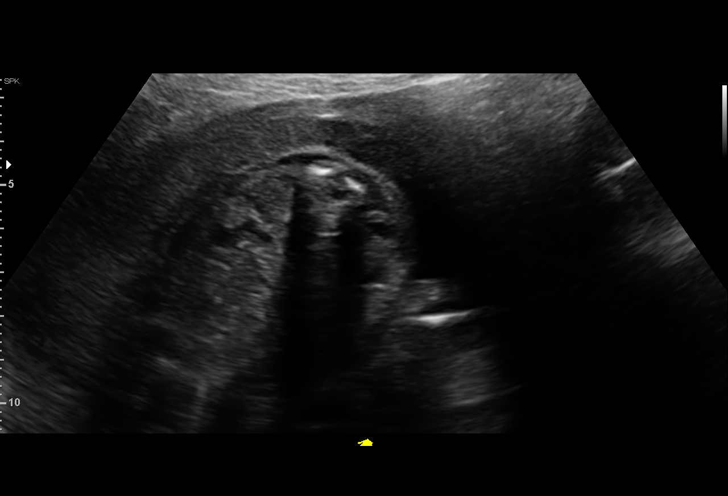
[im 32/58]
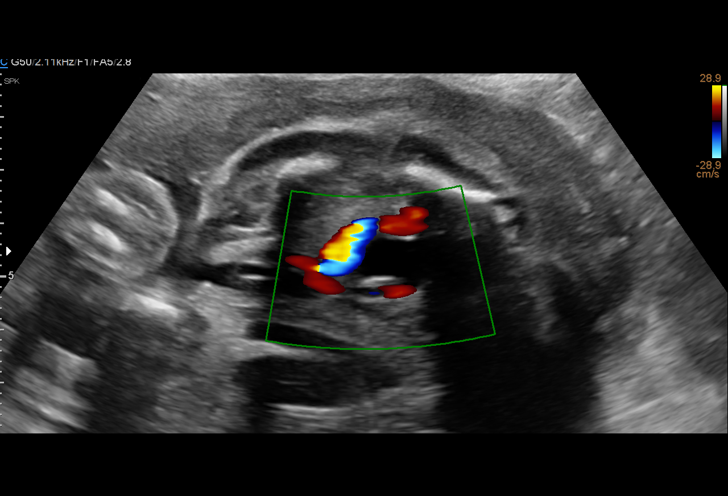
[im 36/58]
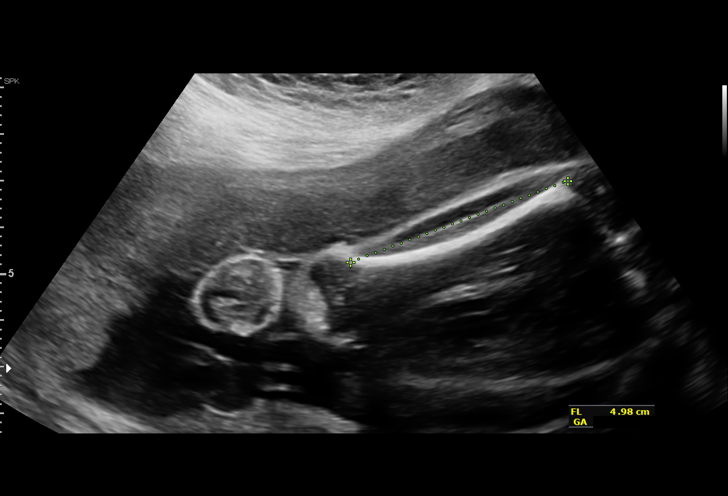
[im 41/58]
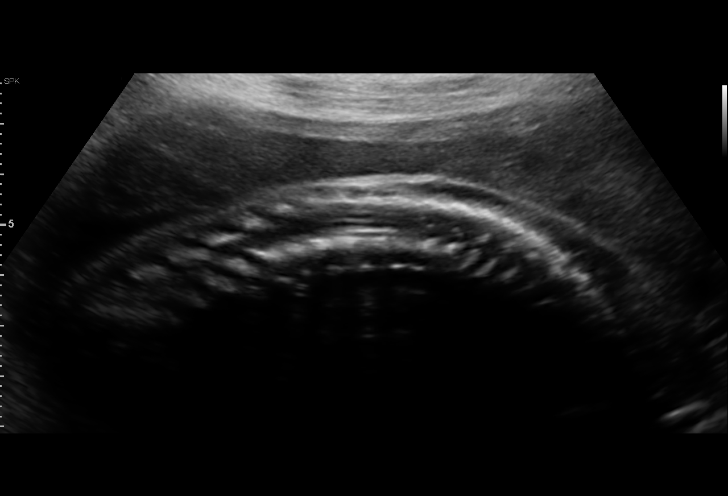
[im 45/58]
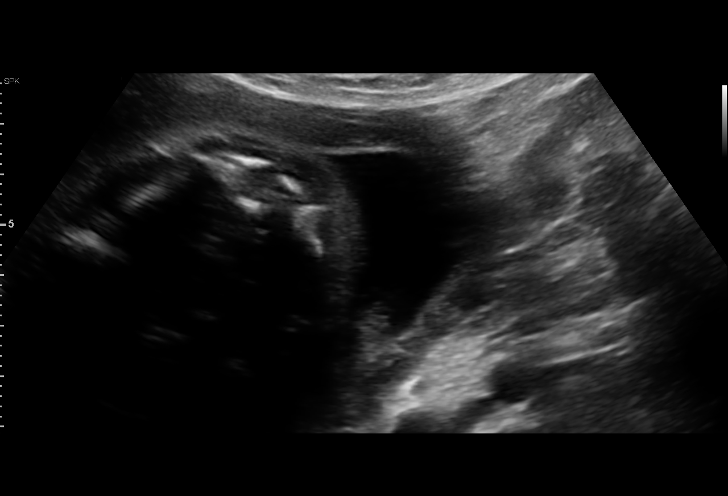
[im 49/58]
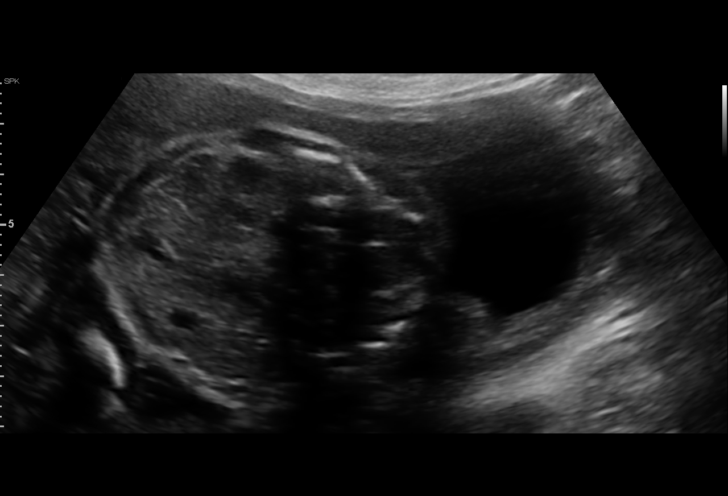
[im 53/58]
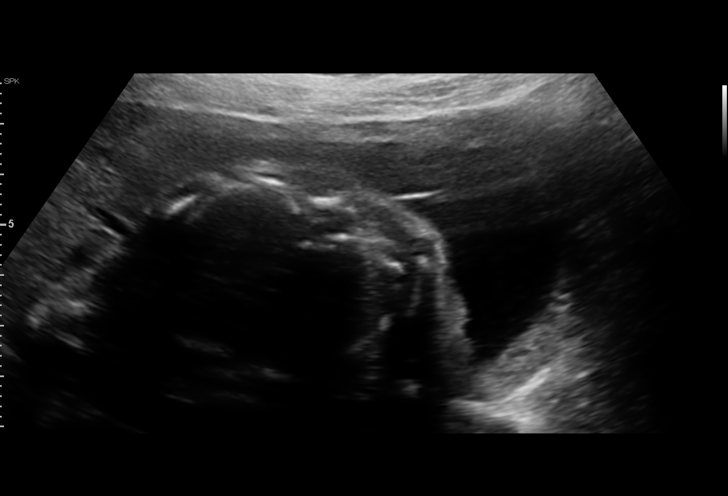
[im 58/58]
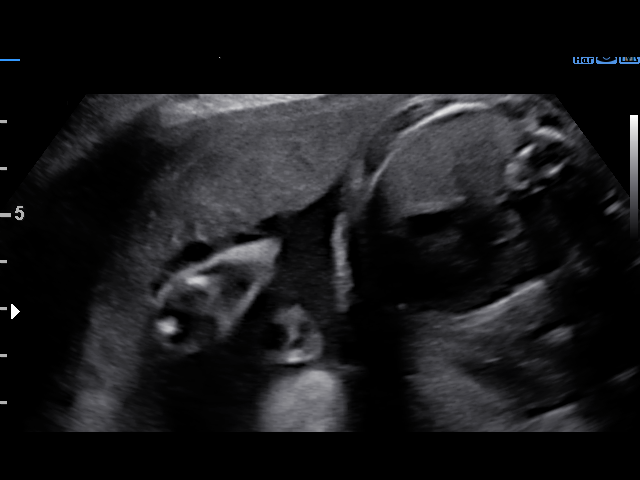

[14 of 28 positions shown; findings below may reference images not displayed]

Road [HOSPITAL]

Indications

27 weeks gestation of pregnancy
Antenatal follow-up for nonvisualized fetal
anatomy
OB History

Blood Type:            Height:  5'7"   Weight (lb):  190       BMI:
Fetal Evaluation

Num Of Fetuses:     1
Fetal Heart         142
Rate(bpm):
Cardiac Activity:   Observed
Presentation:       Cephalic
Placenta:           Anterior, above cervical os

Amniotic Fluid
AFI FV:      Subjectively within normal limits

Largest Pocket(cm)
2.76
Biometry

BPD:      65.6  mm     G. Age:  26w 3d         19  %    CI:        73.98   %    70 - 86
FL/HC:      20.4   %    18.6 -
HC:      242.2  mm     G. Age:  26w 2d          7  %    HC/AC:      1.16        1.05 -
AC:      208.7  mm     G. Age:  25w 3d          6  %    FL/BPD:     75.2   %    71 - 87
FL:       49.3  mm     G. Age:  26w 4d         21  %    FL/AC:      23.6   %    20 - 24
HUM:      45.7  mm     G. Age:  27w 0d         43  %
Est. FW:     878  gm    1 lb 15 oz      28  %
Gestational Age

LMP:           30w 3d        Date:  04/18/16                 EDD:   01/23/17
U/S Today:     26w 1d                                        EDD:   02/22/17
Best:          27w 1d     Det. By:  Previous Ultrasound      EDD:   02/15/17
(06/20/16)
Anatomy

Cranium:               Appears normal         Aortic Arch:            Appears normal
Cavum:                 Appears normal         Ductal Arch:            Appears normal
Ventricles:            Appears normal         Diaphragm:              Appears normal
Choroid Plexus:        Previously seen        Stomach:                Appears normal, left
sided
Cerebellum:            Previously seen        Abdomen:                Appears normal
Posterior Fossa:       Previously seen        Abdominal Wall:         Previously seen
Nuchal Fold:           Not applicable (>20    Cord Vessels:           Appears normal (3
wks GA)                                        vessel cord)
Face:                  Orbits and profile     Kidneys:                Appear normal
previously seen
Lips:                  Previously seen        Bladder:                Appears normal
Thoracic:              Appears normal         Spine:                  Limited views
appear normal
Heart:                 Appears normal         Upper Extremities:      Previously seen
(4CH, axis, and situs
RVOT:                  Previously seen        Lower Extremities:      Previously seen
LVOT:                  Previously seen

Other:  Fetus appears to be a female. Heels and 5th digit  previously
visualized. Technically difficult due to fetal position.
Cervix Uterus Adnexa

Cervix
Length:           3.12  cm.
Normal appearance by transabdominal scan.
Impression

Single IUP at 27w 1d
Normal interval anatomy
Some views of the fetal spine are limited due to position, but
appears normal
Fetal growth is appropriate (28th %tile)
Anterior placenta without previa
Normal amniotic fluid volume
Recommendations

Follow-up ultrasounds as clinically indicated.

## 2018-07-15 IMAGING — US US MFM OB LIMITED
1 series · 13 of 22 positions shown · non-contrast
Comparison: none

[Series 1: us mfm ob limited · 13 of 22 slices shown]
[im 1/22]
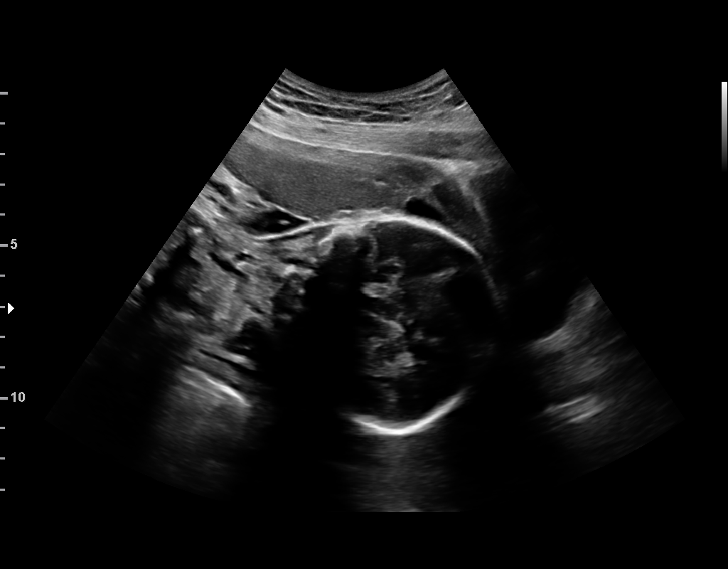
[im 3/22]
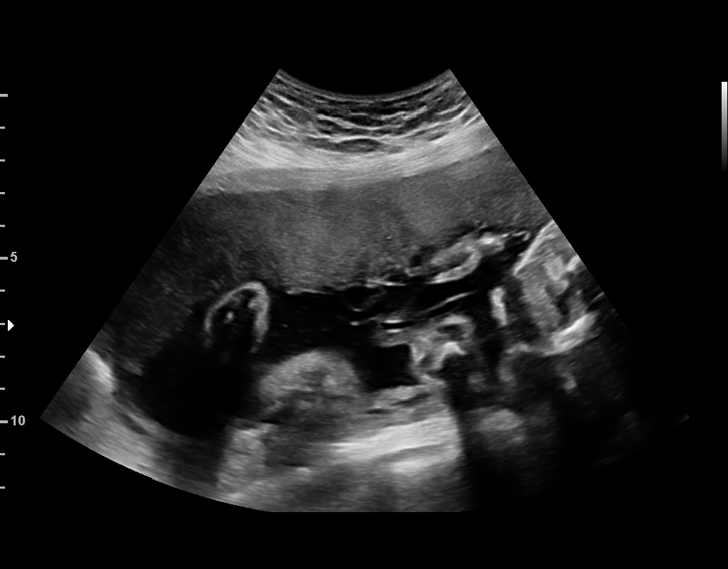
[im 5/22]
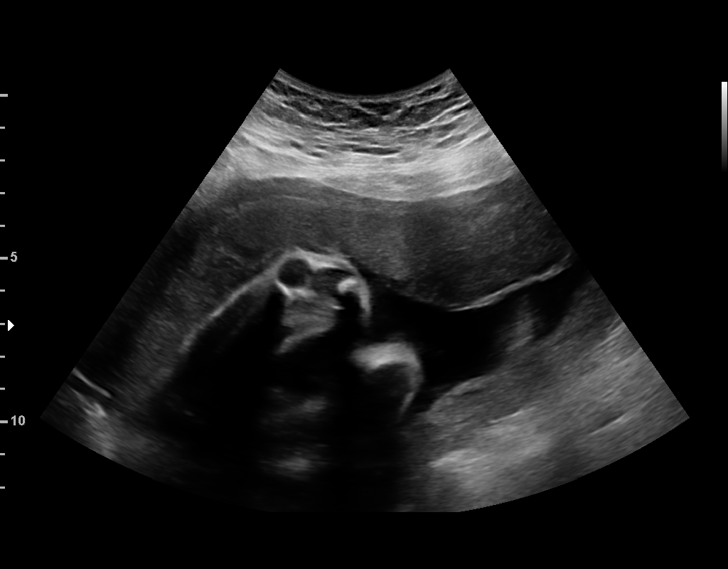
[im 6/22]
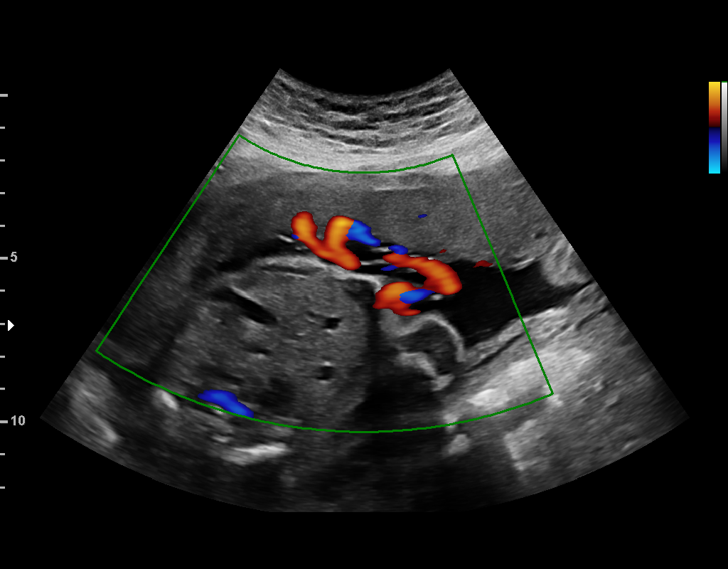
[im 8/22]
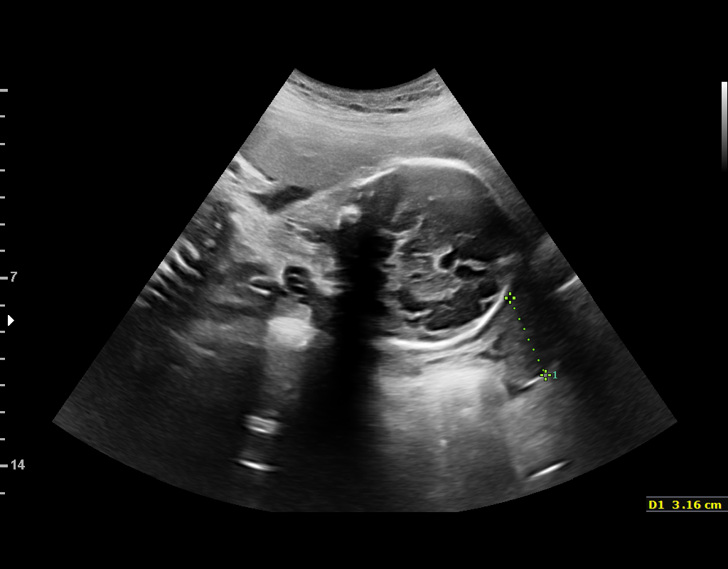
[im 10/22]
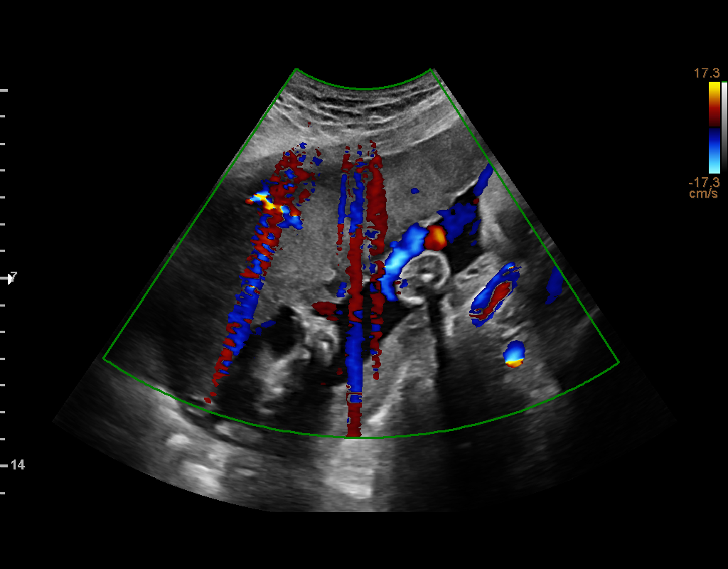
[im 12/22]
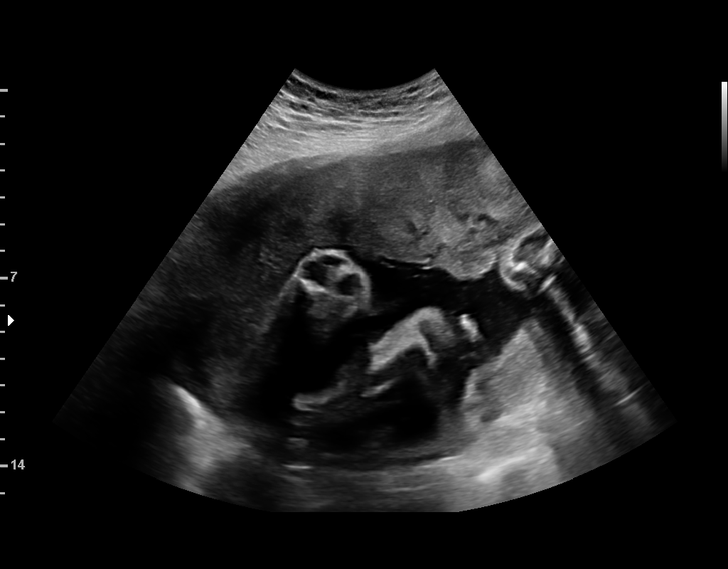
[im 13/22]
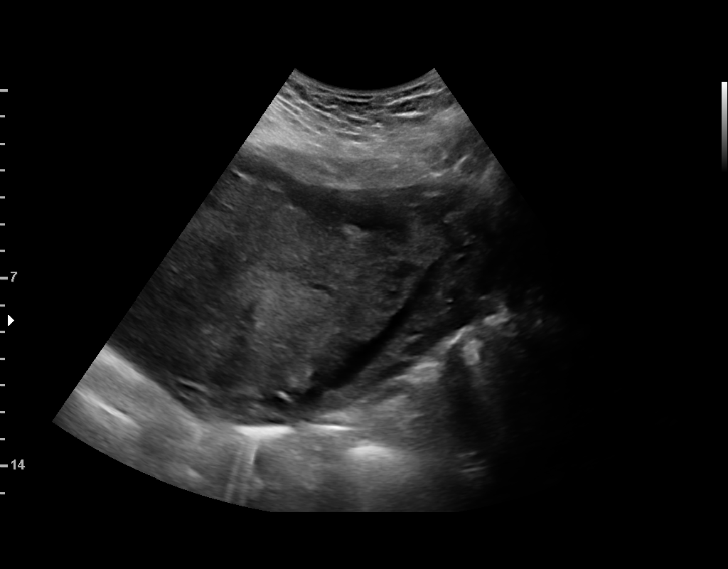
[im 15/22]
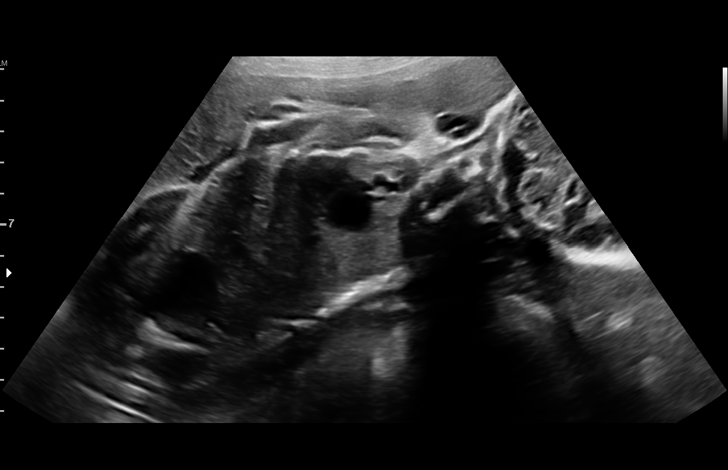
[im 17/22]
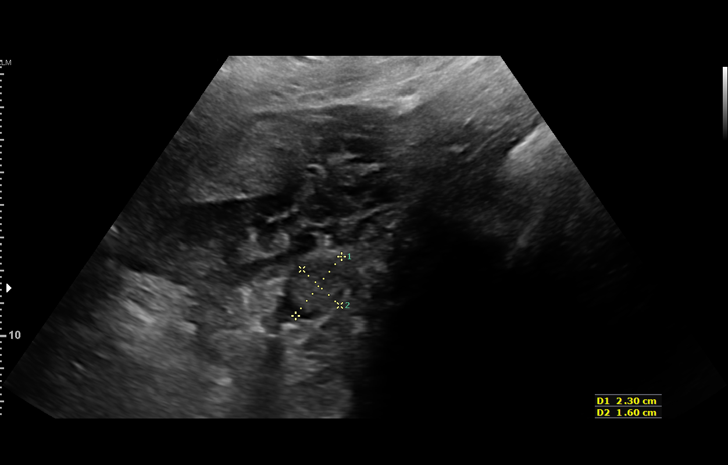
[im 18/22]
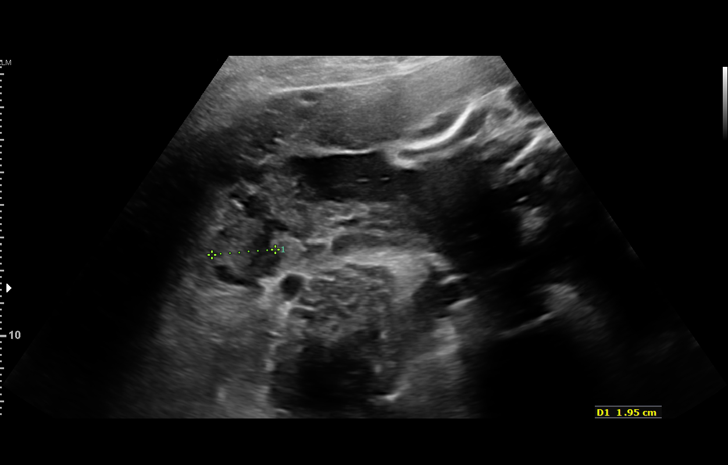
[im 20/22]
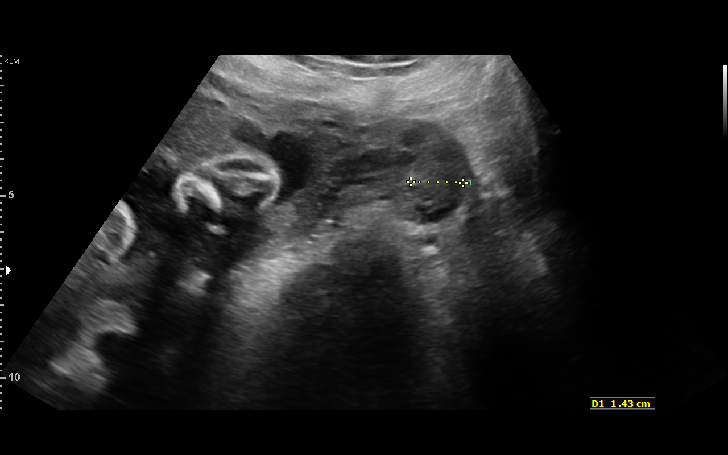
[im 22/22]
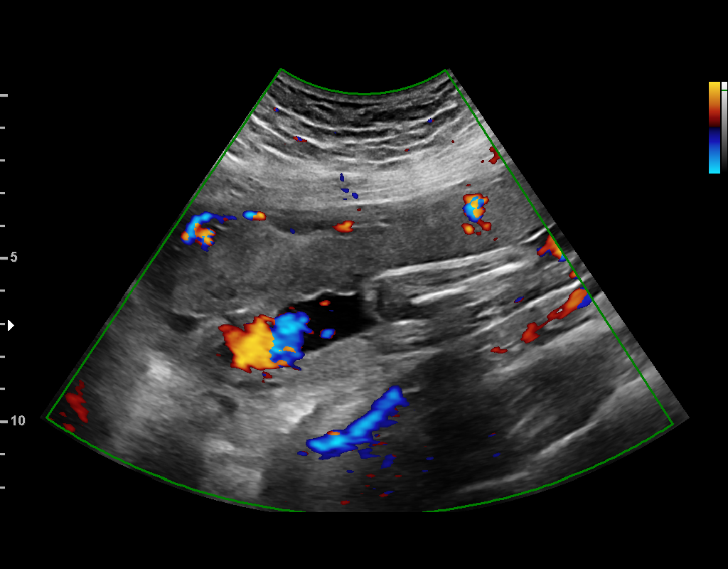

[13 of 22 positions shown; findings below may reference images not displayed]

Road [HOSPITAL]

1  KAM [address]           [id number]      [id number]     [id number]
Indications

28 weeks gestation of pregnancy
Vaginal bleeding in pregnancy, third trimester
OB History

Blood Type:            Height:  5'7"   Weight (lb):  190      BMI:
Fetal Evaluation

Num Of Fetuses:     1
Fetal Heart         146
Rate(bpm):
Cardiac Activity:   Observed
Presentation:       Cephalic
Placenta:           Anterior, above cervical os
P. Cord Insertion:  Visualized

Amniotic Fluid
AFI FV:      Subjectively within normal limits

Largest Pocket(cm)
3.7

Comment:    No placental abruption or previa identified.
Gestational Age

LMP:           31w 5d       Date:   04/18/16                 EDD:   01/23/17
Best:          28w 3d    Det. By:   Previous Ultrasound      EDD:   02/15/17
(06/20/16)
Cervix Uterus Adnexa

Cervix
Length:            3.2  cm.
Normal appearance by transabdominal scan.

Uterus
No abnormality visualized.

Left Ovary
Within normal limits.

Right Ovary
Within normal limits.

Cul De Sac:   No free fluid seen.

Adnexa:       No abnormality visualized.
Impression

Singleton intrauterine pregnancy at 28 weeks4 days gestation
with fetal cardiac activity
Cephalic presentation
Anterior placenta without evidence of previa
No sonographic evidence of intrauterine bleed
Normal appearing amniotic fluid volume
Cervix 
 3.2 cm on transabdominal ultrasound (given
significant vaginal bleeding)
Recommendations

Follow-up ultrasounds as clinically indicated.

## 2018-07-28 ENCOUNTER — Ambulatory Visit (INDEPENDENT_AMBULATORY_CARE_PROVIDER_SITE_OTHER): Payer: BLUE CROSS/BLUE SHIELD | Admitting: Physician Assistant

## 2018-07-28 ENCOUNTER — Encounter: Payer: Self-pay | Admitting: Physician Assistant

## 2018-07-28 ENCOUNTER — Other Ambulatory Visit: Payer: Self-pay

## 2018-07-28 VITALS — BP 112/72 | HR 89 | Temp 98.1°F | Resp 16 | Ht 66.5 in | Wt 207.0 lb

## 2018-07-28 DIAGNOSIS — R300 Dysuria: Secondary | ICD-10-CM

## 2018-07-28 DIAGNOSIS — N941 Unspecified dyspareunia: Secondary | ICD-10-CM | POA: Diagnosis not present

## 2018-07-28 DIAGNOSIS — N898 Other specified noninflammatory disorders of vagina: Secondary | ICD-10-CM | POA: Diagnosis not present

## 2018-07-28 LAB — POCT URINALYSIS DIP (MANUAL ENTRY)
Blood, UA: NEGATIVE
Glucose, UA: NEGATIVE mg/dL
Leukocytes, UA: NEGATIVE
Nitrite, UA: NEGATIVE
Protein Ur, POC: NEGATIVE mg/dL
Spec Grav, UA: 1.02 (ref 1.010–1.025)
Urobilinogen, UA: 1 E.U./dL
pH, UA: 7.5 (ref 5.0–8.0)

## 2018-07-28 LAB — POCT WET + KOH PREP
Trich by wet prep: ABSENT
Yeast by KOH: ABSENT
Yeast by wet prep: ABSENT

## 2018-07-28 MED ORDER — FLUCONAZOLE 150 MG PO TABS: 150.0000 mg | ORAL_TABLET | Freq: Once | ORAL | 0 refills | Status: AC

## 2018-07-28 NOTE — Progress Notes (Signed)
Sabrina Coleman  MRN: [id number] DOB: [birth year]  PCP: Magdalene River, PA-C  Subjective:  Pt is a pleasant 23 year old female who presents to clinic for vaginal discharge x 1 month Endorses itching and "not a normal odor" along with external itching.  She has had intercourse with the same partner. STD panel negative in Sept. She underwent surgery for a [place] butt lift in Oct. Same sexual partner since STD testing. She is not concerned today for STD testing.  Endorses mild dysuria. Denies increased frequency or urgency, abdominal pain, back pain, fever, chills.  She used external monistat cream a few days ago.   Endorses pain during intercourse "since forever". Denies h/o trauma. Does not use lubrication.   Review of Systems  Constitutional: Negative for chills, fatigue and fever.  Respiratory: Negative for cough, shortness of breath and wheezing.   Gastrointestinal: Negative for abdominal pain, diarrhea, nausea and vomiting.  Genitourinary: Positive for vaginal bleeding and vaginal discharge. Negative for difficulty urinating, dysuria, enuresis, flank pain, frequency, hematuria, pelvic pain, urgency and vaginal pain.  Musculoskeletal: Negative for back pain.    Patient Active Problem List   Diagnosis Date Noted  . Contraception management 01/14/2017  . Mild tetrahydrocannabinol (THC) abuse 08/14/2016  . Abnormal Pap smear of cervix 08/12/2016    Current Outpatient Medications on File Prior to Visit  Medication Sig Dispense Refill  . etonogestrel (NEXPLANON) 68 MG IMPL implant 68 mg by Subdermal route once.     No current facility-administered medications on file prior to visit.     No Known Allergies   Objective:  BP 112/72   Pulse 89   Temp 98.1 F (36.7 C) (Oral)   Resp 16   Ht 5' 6.5" (1.689 m)   Wt 207 lb (93.9 kg)   LMP 07/14/2018   SpO2 98%   BMI 32.91 kg/m   Physical Exam Vitals signs reviewed.  Constitutional:      Appearance: Normal  appearance.  Abdominal:     Palpations: Abdomen is soft.     Tenderness: There is no abdominal tenderness.  Genitourinary:    Vagina: Vaginal discharge present. No tenderness.     Cervix: No cervical motion tenderness or discharge.     Comments: White adherent discharge vaginal wall.   Neurological:     Mental Status: She is alert.  Psychiatric:        Mood and Affect: Mood normal.        Behavior: Behavior normal.     Results for orders placed or performed in visit on 07/28/18  POCT Wet + KOH Prep  Result Value Ref Range   Yeast by KOH Absent Absent   Yeast by wet prep Absent Absent   WBC by wet prep None (A) Few   Clue Cells Wet Prep HPF POC None None   Trich by wet prep Absent Absent   Bacteria Wet Prep HPF POC Many (A) Few   Epithelial Cells By [employer] ([facility]) Many (A) None, Few, Too numerous to count   RBC,UR,HPF,POC None None RBC/hpf  POCT urinalysis dipstick  Result Value Ref Range   Color, UA yellow yellow   Clarity, UA clear clear   Glucose, UA negative negative mg/dL   Bilirubin, UA small (A) negative   Ketones, POC UA trace (5) (A) negative mg/dL   Spec Grav, UA 4.098 1.191 - 1.025   Blood, UA negative negative   pH, UA 7.5 5.0 - 8.0   Protein Ur, POC negative negative  mg/dL   Urobilinogen, UA 1.0 0.2 or 1.0 E.U./dL   Nitrite, UA Negative Negative   Leukocytes, UA Negative Negative    Assessment and Plan :  1. Vaginal discharge - pt endorses vaginal itching with white discharge. Negative wet prep. Suspect false negative. Plan to treat with diflucan. RTC if no improvement.  - POCT Wet + KOH Prep - fluconazole (DIFLUCAN) 150 MG tablet; Take 1 tablet (150 mg total) by mouth once for 1 dose. Repeat if needed  Dispense: 2 tablet; Refill: 0  2. Dysuria - negative UA - POCT urinalysis dipstick  3. Dyspareunia in female - Ambulatory referral to Physical Therapy   Marco CollieWhitney Karlye Ihrig, PA-C  [facility] at [facility] 07/28/2018 2:47  PM  Please note: Portions of this report may have been transcribed using dragon voice recognition software. Every effort was made to ensure accuracy; however, inadvertent computerized transcription errors may be present.

## 2018-07-28 NOTE — Patient Instructions (Addendum)
Diflucan pill once. If no improvement you can take another dose in one week.  Stay well hydrated! Drink half your body weight in oz./day (three nalgene bottles.)   You will receive a phone call (in 1-2 weeks) to schedule an appointment with [facility].    Thank you for coming in today. I hope you feel we met your needs.  Feel free to call PCP if you have any questions or further requests.  Please consider signing up for MyChart if you do not already have it, as this is a great way to communicate with me.  Best,  Whitney McVey, PA-C  IF you received an x-ray today, you will receive an invoice from [facility]. Please contact [facility] at [phone] with questions or concerns regarding your invoice.   IF you received labwork today, you will receive an invoice from [facility]. Please contact [facility] at [phone] with questions or concerns regarding your invoice.   Our billing staff will not be able to assist you with questions regarding bills from these companies.  You will be contacted with the lab results as soon as they are available. The fastest way to get your results is to activate your My Chart account. Instructions are located on the last page of this paperwork. If you have not heard from [place] regarding the results in 2 weeks, please contact this office.

## 2018-09-01 ENCOUNTER — Encounter: Payer: BLUE CROSS/BLUE SHIELD | Admitting: Family Medicine

## 2020-10-10 ENCOUNTER — Ambulatory Visit: Payer: BLUE CROSS/BLUE SHIELD | Admitting: Obstetrics

## 2020-10-31 ENCOUNTER — Other Ambulatory Visit: Payer: Self-pay

## 2020-10-31 ENCOUNTER — Encounter: Payer: Self-pay | Admitting: Obstetrics

## 2020-10-31 ENCOUNTER — Ambulatory Visit: Payer: BC Managed Care – PPO | Admitting: Obstetrics

## 2020-10-31 VITALS — Ht 67.0 in | Wt 243.0 lb

## 2020-10-31 DIAGNOSIS — Z3046 Encounter for surveillance of implantable subdermal contraceptive: Secondary | ICD-10-CM | POA: Diagnosis not present

## 2020-10-31 NOTE — Progress Notes (Signed)
GYN presents for Nexplanon removal.

## 2020-10-31 NOTE — Progress Notes (Signed)
NEXPLANON REMOVAL NOTE  Date of LMP:   unknown  Contraception used: *Nexplanon   Indications:  The patient desires removal of Nexplanon.  She understands risks, benefits, and alternatives to Implanon and would like to proceed.  Anesthesia:   Lidocaine 1% plain.  Procedure:  A time-out was performed confirming the procedure and the patient's allergy status.  Complications: None                      The rod was palpated and the area was sterilely prepped.  The area beneath the distal tip was anesthetized with 1% xylocaine and the skin incised                       Over the tip and the tip was exposed, grasped with forcep and removed intact.  A single suture of 4-0 Vicryl was used to close incision.  Steri strip                       And a bandage applied and the arm was wrapped with gauze bandage.  The patient tolerated well.  Instructions:  The patient was instructed to remove the dressing in 24 hours and that some bruising is to be expected.  She was advised to use over the counter analgesics as needed for any pain at the site.  She is to keep the area dry for 24 hours and to call if her hand or arm becomes cold, numb, or blue.  Return visit:  Return in 2 weeks   Brock Bad, MD 10/31/2020 10:42 AM

## 2020-11-12 ENCOUNTER — Ambulatory Visit: Payer: BC Managed Care – PPO | Admitting: Obstetrics

## 2022-01-20 ENCOUNTER — Encounter (HOSPITAL_COMMUNITY): Payer: Self-pay | Admitting: *Deleted

## 2022-01-20 ENCOUNTER — Other Ambulatory Visit: Payer: Self-pay

## 2022-01-20 ENCOUNTER — Inpatient Hospital Stay (HOSPITAL_COMMUNITY)
Admission: AD | Admit: 2022-01-20 | Discharge: 2022-01-20 | Disposition: A | Discharge status: Home / Self Care | Payer: No Typology Code available for payment source | Attending: Obstetrics & Gynecology | Admitting: Obstetrics & Gynecology

## 2022-01-20 DIAGNOSIS — Z3202 Encounter for pregnancy test, result negative: Secondary | ICD-10-CM | POA: Diagnosis not present

## 2022-01-20 DIAGNOSIS — N939 Abnormal uterine and vaginal bleeding, unspecified: Secondary | ICD-10-CM | POA: Diagnosis present

## 2022-01-20 LAB — POCT PREGNANCY, URINE: Preg Test, Ur: NEGATIVE

## 2022-01-20 LAB — HCG, QUANTITATIVE, PREGNANCY: hCG, Beta Chain, Quant, S: 4 m[IU]/mL (ref <5)

## 2022-01-20 NOTE — MAU Note (Signed)
Sabrina Coleman is a 26 y.o. here in MAU reporting: she's had a +HPT on 01/16/2022 and began heavy VB today.  States saturated through a sanitary napkin in 1 hour.  Also reports has lower abdominal pain in varying degrees.   LMP: 12/11/2021 Onset of complaint: today Pain score: 5/10 Vitals:   01/20/22 1421  BP: 135/68  Pulse: 89  Resp: 20  Temp: 98.8 F (37.1 C)  SpO2: 100%     FHT:N/A Lab orders placed from triage:   UPT

## 2022-01-20 NOTE — MAU Provider Note (Signed)
History     CSN: [id number]  Arrival date and time: 01/20/22 1350   None     Chief Complaint  Patient presents with   Vaginal Bleeding   HPI Sabrina Coleman is a 26 y.o. [id number] who presents to MAU for vaginal bleeding. Patient reports positive UPT at home on 6/29. She reports today while she was driving she started having a lot of intermittent lower abdominal cramping and heavy bright red bleeding. She reports she passed a few quarter-seized clots. Has not checked bleeding since arrival to MAU. Currently rates lower abdominal cramping 8/10, however has not taken anything for pain. LMP was 5/24. [facility] and that she is scheduled for follow up on 7/5. She is looking for a new OBGYN as she just changed insurance.   OB History     Gravida  2   Para  1   Term      Preterm  1   AB  1   Living  1      SAB      IAB  1   Ectopic      Multiple  0   Live Births  1           Past Medical History:  Diagnosis Date   Chlamydia    Hypertension    Infection    UTI    Past Surgical History:  Procedure Laterality Date   CESAREAN SECTION  11/30/2016   Procedure: CESAREAN SECTION;  Surgeon: Adam Phenix, MD;  Location: [facility] BIRTHING SUITES;  Service: Obstetrics;;   INDUCED ABORTION     WISDOM TOOTH EXTRACTION      Family History  Problem Relation Age of Onset   Asthma Brother    Cancer Maternal Grandmother    Asthma Brother     Social History   Tobacco Use   Smoking status: Former    Types: Cigars   Smokeless tobacco: Never  Vaping Use   Vaping Use: Never used  Substance Use Topics   Alcohol use: No   Drug use: No    Comment: no longer    Allergies: No Known Allergies  No medications prior to admission.   Review of Systems  Constitutional: Negative.   Respiratory: Negative.    Cardiovascular: Negative.   Gastrointestinal:  Positive for abdominal pain (cramping).  Genitourinary:  Positive for  vaginal bleeding.  Musculoskeletal: Negative.   Neurological: Negative.    Physical Exam   Blood pressure 135/68, pulse 89, temperature 98.8 F (37.1 C), temperature source Oral, resp. rate 20, height 5\' 6"  (1.676 m), weight 107 kg, last menstrual period 12/11/2021, SpO2 100 %.  Physical Exam Vitals and nursing note reviewed.  Constitutional:      General: She is not in acute distress. Eyes:     Pupils: Pupils are equal, round, and reactive to light.  Cardiovascular:     Rate and Rhythm: Normal rate.  Pulmonary:     Effort: Pulmonary effort is normal.  Musculoskeletal:        General: Normal range of motion.     Cervical back: Normal range of motion.  Skin:    General: Skin is warm and dry.  Neurological:     General: No focal deficit present.     Mental Status: She is alert and oriented to person, place, and time.  Psychiatric:        Mood and Affect: Mood normal.  Behavior: Behavior normal.        Thought Content: Thought content normal.        Judgment: Judgment normal.    MAU Course  Procedures  MDM UPT negative HCG <5  I discussed with patient that both urine pregnancy test and blood test are negative and that she is not pregnant. I reviewed likely false positive pregnancy test and that this could be late menstrual cycle starting vs possibility of early miscarriage, although cannot say for certain as there are no previous results. I reviewed with patient to follow up with OBGYN.   Assessment and Plan  Vaginal bleeding Pregnancy test negative  - Discharge home in stable condition - Follow up with OBGYN. List of providers given - Return to MAU as needed for [facility] emergencies     Brand Males, CNM 01/20/2022, 3:51 PM

## 2022-01-20 NOTE — Discharge Instructions (Signed)
[place] [facility] Providers   [facility]             [address], [place], [place] [address] [phone]  [facility]                                                             [address], [address], [place], [place], [address] [phone]  [facility]                                    [address], [address], [place], [place], [address] [phone]  [facility] [place] [address], [place], [place], [address] [phone]  [facility]                                 [address], [place], [place], [address] [phone]  [facility]                                    [address], [place], [place], [address] [phone]  [facility] [address], [address], [place], [place], [address]                              [facility] [address], [place], [place], [address] [phone]  [facility]         Phone: [phone]  [facility]      Phone: [phone]   [facility]      Phone: [phone]  [facility]         Phone: [phone]   [facility]    Phone: [phone]  [facility]      Phone: [phone]  [facility]     Phone: [phone]  [facility]        Phone: [phone]  [facility]      Phone: [phone]  

## 2023-03-21 ENCOUNTER — Encounter: Payer: Self-pay | Admitting: Advanced Practice Midwife

## 2023-03-21 ENCOUNTER — Inpatient Hospital Stay (HOSPITAL_COMMUNITY): Payer: BC Managed Care – PPO

## 2023-03-21 ENCOUNTER — Inpatient Hospital Stay (HOSPITAL_COMMUNITY)
Admission: AD | Admit: 2023-03-21 | Discharge: 2023-03-21 | Disposition: A | Discharge status: Home / Self Care | Payer: BC Managed Care – PPO | Attending: Obstetrics and Gynecology | Admitting: Obstetrics and Gynecology

## 2023-03-21 ENCOUNTER — Encounter (HOSPITAL_COMMUNITY): Payer: Self-pay | Admitting: Obstetrics and Gynecology

## 2023-03-21 DIAGNOSIS — O209 Hemorrhage in early pregnancy, unspecified: Secondary | ICD-10-CM | POA: Insufficient documentation

## 2023-03-21 DIAGNOSIS — O3680X0 Pregnancy with inconclusive fetal viability, not applicable or unspecified: Secondary | ICD-10-CM | POA: Insufficient documentation

## 2023-03-21 DIAGNOSIS — Z3A00 Weeks of gestation of pregnancy not specified: Secondary | ICD-10-CM | POA: Insufficient documentation

## 2023-03-21 DIAGNOSIS — Z3687 Encounter for antenatal screening for uncertain dates: Secondary | ICD-10-CM | POA: Diagnosis not present

## 2023-03-21 DIAGNOSIS — R9389 Abnormal findings on diagnostic imaging of other specified body structures: Secondary | ICD-10-CM | POA: Diagnosis not present

## 2023-03-21 LAB — URINALYSIS, ROUTINE W REFLEX MICROSCOPIC
Bacteria, UA: NONE SEEN
Glucose, UA: NEGATIVE mg/dL
Ketones, ur: NEGATIVE mg/dL
Leukocytes,Ua: NEGATIVE
Nitrite: NEGATIVE
Protein, ur: 30 mg/dL — AB
RBC / HPF: >50 RBC/hpf (ref 0–5)
Specific Gravity, Urine: 1.026 (ref 1.005–1.030)
pH: 6 (ref 5.0–8.0)

## 2023-03-21 LAB — HCG, QUANTITATIVE, PREGNANCY: hCG, Beta Chain, Quant, S: 3897 m[IU]/mL — ABNORMAL HIGH (ref <5)

## 2023-03-21 LAB — POCT PREGNANCY, URINE: Preg Test, Ur: POSITIVE — AB

## 2023-03-21 LAB — CBC
HCT: 36.6 % (ref 36.0–46.0)
Hemoglobin: 12 g/dL (ref 12.0–15.0)
MCH: 31.7 pg (ref 26.0–34.0)
MCHC: 32.8 g/dL (ref 30.0–36.0)
MCV: 96.6 fL (ref 80.0–100.0)
Platelets: 282 10*3/uL (ref 150–400)
RBC: 3.79 MIL/uL — ABNORMAL LOW (ref 3.87–5.11)
RDW: 12.5 % (ref 11.5–15.5)
WBC: 9.2 10*3/uL (ref 4.0–10.5)
nRBC: 0 % (ref 0.0–0.2)

## 2023-03-21 LAB — WET PREP, GENITAL
Clue Cells Wet Prep HPF POC: NONE SEEN
Sperm: NONE SEEN
Trich, Wet Prep: NONE SEEN
WBC, Wet Prep HPF POC: >=10 — AB (ref <10)
Yeast Wet Prep HPF POC: NONE SEEN

## 2023-03-21 LAB — HIV ANTIBODY (ROUTINE TESTING W REFLEX): HIV Screen 4th Generation wRfx: NONREACTIVE

## 2023-03-21 NOTE — MAU Provider Note (Signed)
Chief Complaint: Abdominal Pain and Vaginal Bleeding   Event Date/Time   First Provider Initiated Contact with Patient 03/21/23 1919      SUBJECTIVE HPI: Sabrina Coleman is a 27 y.o. [id number] at Unknown gestational age who presents to Maternity Admissions reporting vaginal bleeding.  Also reports mild lower abdominal discomfort.  Vaginal Bleeding: Since 03/19/2023 that started out as light spotting but has progressed to medium bright red bleeding.  Is soaking small maxi pad per hour. Passage of tissue or clots: Passed small amount of tissue while in MAU. Dizziness: Denies  A POS  Associated signs and symptoms: Negative for fever, chills, urinary complaints, GI complaints, dizziness, vaginal discharge.  Past Medical History:  Diagnosis Date   Chlamydia    Hypertension    Infection    UTI   OB History  Gravida Para Term Preterm AB Living  4 1   1 2 1   SAB IAB Ectopic Multiple Live Births    2   0 1    # Outcome Date GA Lbr Len/2nd Weight Sex Type Anes PTL Lv  4 Current           3 IAB 11/2022          2 Preterm 11/30/16 [redacted]w[redacted]d  910 g F CS-LTranv Gen  LIV  1 IAB            Past Surgical History:  Procedure Laterality Date   CESAREAN SECTION  11/30/2016   Procedure: CESAREAN SECTION;  Surgeon: Adam Phenix, MD;  Location: [facility] BIRTHING SUITES;  Service: Obstetrics;;   INDUCED ABORTION     WISDOM TOOTH EXTRACTION     Social History   Socioeconomic History   Marital status: Single    Spouse name: Not on file   Number of children: 1   Years of education: Not on file   Highest education level: Not on file  Occupational History   Not on file  Tobacco Use   Smoking status: Former    Types: Cigars   Smokeless tobacco: Never  Vaping Use   Vaping status: Never Used  Substance and Sexual Activity   Alcohol use: No   Drug use: No    Comment: no longer   Sexual activity: Not Currently    Birth control/protection: None  Other Topics Concern   Not on file  Social History  Narrative   Not on file   Social Determinants of Health   Financial Resource Strain: Not on file  Food Insecurity: Not on file  Transportation Needs: Not on file  Physical Activity: Insufficiently Active (04/01/2018)   Exercise Vital Sign    Days of Exercise per Week: 2 days    Minutes of Exercise per Session: 30 min  Stress: Not on file  Social Connections: Not on file  Intimate Partner Violence: Not on file   No current facility-administered medications on file prior to encounter.   No current outpatient medications on file prior to encounter.   No Known Allergies  I have reviewed the past Medical Hx, Surgical Hx, Social Hx, Allergies and Medications.   Review of Systems  Constitutional:  Negative for chills and fever.  Gastrointestinal:  Positive for abdominal pain. Negative for constipation, diarrhea, nausea and vomiting.  Genitourinary:  Positive for vaginal bleeding. Negative for dysuria, frequency, hematuria, urgency and vaginal discharge.  Neurological:  Negative for dizziness.    OBJECTIVE Patient Vitals for the past 24 hrs:  BP Temp Temp src Pulse Resp SpO2 Height Weight  03/21/23 1801 113/69 98.1 F (36.7 C) Oral 90 16 99 % -- --  03/21/23 1759 -- -- -- -- -- -- 5\' 6"  (1.676 m) 109.3 kg   Constitutional: Well-developed, well-nourished female in no acute distress.  Cardiovascular: normal rate Respiratory: normal rate and effort.  GI: Abd soft, non-tender.  MS: Extremities nontender, no edema, normal ROM Neurologic: Alert and oriented x 4.  GU: Neg CVAT.  SPECULUM EXAM: NEFG, physiologic discharge, small amount of bright red blood noted, cervix clean, closed  BIMANUAL: cervix closed; uterus top- normal size, no adnexal tenderness or masses. No CMT.  LAB RESULTS Results for orders placed or performed during the hospital encounter of 03/21/23 (from the past 24 hour(s))  Pregnancy, urine POC     Status: Abnormal   Collection Time: 03/21/23  6:11 PM  Result  Value Ref Range   Preg Test, Ur POSITIVE (A) NEGATIVE  Urinalysis, Routine w reflex microscopic -Urine, Clean Catch     Status: Abnormal   Collection Time: 03/21/23  6:14 PM  Result Value Ref Range   Color, Urine YELLOW YELLOW   APPearance HAZY (A) CLEAR   Specific Gravity, Urine 1.026 1.005 - 1.030   pH 6.0 5.0 - 8.0   Glucose, UA NEGATIVE NEGATIVE mg/dL   Hgb urine dipstick LARGE (A) NEGATIVE   Bilirubin Urine SMALL (A) NEGATIVE   Ketones, ur NEGATIVE NEGATIVE mg/dL   Protein, ur 30 (A) NEGATIVE mg/dL   Nitrite NEGATIVE NEGATIVE   Leukocytes,Ua NEGATIVE NEGATIVE   RBC / HPF >50 0 - 5 RBC/hpf   WBC, UA 11-20 0 - 5 WBC/hpf   Bacteria, UA NONE SEEN NONE SEEN   Squamous Epithelial / HPF 0-5 0 - 5 /HPF   Mucus PRESENT   hCG, quantitative, pregnancy     Status: Abnormal   Collection Time: 03/21/23  7:58 PM  Result Value Ref Range   hCG, Beta Chain, Quant, S 3,897 (H) <5 mIU/mL  CBC     Status: Abnormal   Collection Time: 03/21/23  7:58 PM  Result Value Ref Range   WBC 9.2 4.0 - 10.5 K/uL   RBC 3.79 (L) 3.87 - 5.11 MIL/uL   Hemoglobin 12.0 12.0 - 15.0 g/dL   HCT 11.9 14.7 - 82.9 %   MCV 96.6 80.0 - 100.0 fL   MCH 31.7 26.0 - 34.0 pg   MCHC 32.8 30.0 - 36.0 g/dL   RDW 56.2 13.0 - 86.5 %   Platelets 282 150 - 400 K/uL   nRBC 0.0 0.0 - 0.2 %  HIV Antibody (routine testing w rflx)     Status: None   Collection Time: 03/21/23  7:58 PM  Result Value Ref Range   HIV Screen 4th Generation wRfx Non Reactive Non Reactive  Wet prep, genital     Status: Abnormal   Collection Time: 03/21/23  8:08 PM   Specimen: Vaginal  Result Value Ref Range   Yeast Wet Prep HPF POC NONE SEEN NONE SEEN   Trich, Wet Prep NONE SEEN NONE SEEN   Clue Cells Wet Prep HPF POC NONE SEEN NONE SEEN   WBC, Wet Prep HPF POC >=10 (A) <10   Sperm NONE SEEN     IMAGING US OB LESS THAN 14 WEEKS WITH OB TRANSVAGINAL  Result Date: 03/21/2023 CLINICAL DATA:  First trimester vaginal bleeding.  Unsure of dates  EXAM: OBSTETRIC <14 WK [place] AND TRANSVAGINAL OB US TECHNIQUE: Both transabdominal and transvaginal ultrasound examinations were performed for complete evaluation of the  gestation as well as the maternal uterus, adnexal regions, and pelvic cul-de-sac. Transvaginal technique was performed to assess early pregnancy. COMPARISON:  None Available. FINDINGS: Intrauterine gestational sac: Not identified Yolk sac:  Not identified Embryo:  Not identified Subchorionic hemorrhage:  None visualized. Maternal uterus/adnexae: Uterus normal. Endometrium thickened to 12 mm. No fluid in the canal. Ovaries are normal. Probable corpus luteal cyst of the RIGHT ovary. No free fluid IMPRESSION: No intrauterine gestational sac, yolk sac, or fetal pole identified. Differential considerations include intrauterine pregnancy too early to be sonographically visualized, missed abortion, or ectopic pregnancy. Followup ultrasound is recommended in 10-14 days for further evaluation. Electronically Signed   By: Genevive Bi M.D.   On: 03/21/2023 21:20    MAU COURSE Orders Placed This Encounter  Procedures   Wet prep, genital   Culture, OB Urine   US OB LESS THAN 14 WEEKS WITH OB TRANSVAGINAL   Urinalysis, Routine w reflex microscopic -Urine, Clean Catch   hCG, quantitative, pregnancy   CBC   RPR   HIV Antibody (routine testing w rflx)   Pregnancy, urine POC   Discharge patient     MDM - Pain and bleeding in early pregnancy with pregnancy of unknown anatomic location, but hemodynamically stable. Pt needed to leave prior to results being back due to childcare issues. Since preliminary review of [place] images showed no GS, CNM reviewed Ectopic and SAB precautions. MyChart message sent since result came back late. Will call in am if message not read. F/U quant in 2 days.    ASSESSMENT 1. First trimester bleeding   2. Pregnancy of unknown anatomic location     PLAN Discharge home in stable condition. Ectopic and SAB precautions.    Follow-up Information     [facility] Follow up.   Specialty: Obstetrics and Gynecology Why: As needed if symptoms worsen Contact information: [address] [place] [address] [phone]        [facility] Follow up on 03/24/2023.   Specialty: Obstetrics and Gynecology Why: 11:00 For repeat blood work Solicitor information: [address] [place] [address] [phone]               Allergies as of 03/21/2023   No Known Allergies      Medication List    You have not been prescribed any medications.      Katrinka Blazing, [place], [place] 03/21/2023  10:04 PM  4

## 2023-03-21 NOTE — MAU Note (Addendum)
.  Sabrina Coleman is a 27 y.o. at Unknown here in MAU reporting: Vaginal bleeding that began this past Thursday. She reports initially her bleeding was light pink and was spotting and now she is soaking a "smaller maxi pad" every hour . Last IC Thursday. Denies vaginal itching. Endorses an "unusual odor."   Last took 1000 mg of Tylenol at 1640.  LMP: June - beginning - unsure Onset of complaint: This past thursday Pain score: 4/10 lower abdomen   Lab orders placed from triage: POCT Preg, UA

## 2023-03-22 LAB — RPR: RPR Ser Ql: NONREACTIVE

## 2023-03-24 ENCOUNTER — Other Ambulatory Visit: Payer: Self-pay

## 2023-03-24 ENCOUNTER — Encounter: Payer: Self-pay | Admitting: Advanced Practice Midwife

## 2023-03-24 ENCOUNTER — Telehealth: Payer: Self-pay | Admitting: Lactation Services

## 2023-03-24 NOTE — Telephone Encounter (Signed)
Patient did not come in for follow up HCG appointment. Dorathy Kinsman, CNW sent patient a message. She has not read it. Called and LM for patient to check her My Chart message and to go to MAU today for follow up blood work.

## 2023-03-26 LAB — GC/CHLAMYDIA PROBE AMP (~~LOC~~) NOT AT ARMC
Chlamydia: NEGATIVE
Comment: NEGATIVE
Comment: NORMAL
Neisseria Gonorrhea: NEGATIVE

## 2023-08-06 ENCOUNTER — Inpatient Hospital Stay (HOSPITAL_COMMUNITY)
Admission: AD | Admit: 2023-08-06 | Discharge: 2023-08-06 | Disposition: A | Discharge status: Home / Self Care | Payer: BC Managed Care – PPO | Attending: Obstetrics and Gynecology | Admitting: Obstetrics and Gynecology

## 2023-08-06 ENCOUNTER — Encounter (HOSPITAL_COMMUNITY): Payer: Self-pay | Admitting: Obstetrics and Gynecology

## 2023-08-06 ENCOUNTER — Inpatient Hospital Stay (HOSPITAL_COMMUNITY): Payer: BC Managed Care – PPO

## 2023-08-06 DIAGNOSIS — Z3A00 Weeks of gestation of pregnancy not specified: Secondary | ICD-10-CM | POA: Diagnosis not present

## 2023-08-06 DIAGNOSIS — O039 Complete or unspecified spontaneous abortion without complication: Secondary | ICD-10-CM

## 2023-08-06 DIAGNOSIS — Z6790 Unspecified blood type, Rh positive: Secondary | ICD-10-CM | POA: Diagnosis not present

## 2023-08-06 DIAGNOSIS — Z3A10 10 weeks gestation of pregnancy: Secondary | ICD-10-CM | POA: Diagnosis not present

## 2023-08-06 DIAGNOSIS — O3680X0 Pregnancy with inconclusive fetal viability, not applicable or unspecified: Secondary | ICD-10-CM | POA: Diagnosis not present

## 2023-08-06 DIAGNOSIS — R9389 Abnormal findings on diagnostic imaging of other specified body structures: Secondary | ICD-10-CM | POA: Diagnosis not present

## 2023-08-06 DIAGNOSIS — O209 Hemorrhage in early pregnancy, unspecified: Secondary | ICD-10-CM | POA: Diagnosis not present

## 2023-08-06 LAB — CBC
HCT: 36.6 % (ref 36.0–46.0)
Hemoglobin: 12.5 g/dL (ref 12.0–15.0)
MCH: 31.6 pg (ref 26.0–34.0)
MCHC: 34.2 g/dL (ref 30.0–36.0)
MCV: 92.4 fL (ref 80.0–100.0)
Platelets: 318 10*3/uL (ref 150–400)
RBC: 3.96 MIL/uL (ref 3.87–5.11)
RDW: 12.3 % (ref 11.5–15.5)
WBC: 9.2 10*3/uL (ref 4.0–10.5)
nRBC: 0 % (ref 0.0–0.2)

## 2023-08-06 LAB — POCT PREGNANCY, URINE: Preg Test, Ur: POSITIVE — AB

## 2023-08-06 LAB — HCG, QUANTITATIVE, PREGNANCY: hCG, Beta Chain, Quant, S: 8045 m[IU]/mL — ABNORMAL HIGH (ref <5)

## 2023-08-06 MED ORDER — MISOPROSTOL 200 MCG PO TABS
800.0000 ug | ORAL_TABLET | Freq: Once | ORAL | 0 refills | Status: DC
Start: 2023-08-06 — End: 2023-08-09

## 2023-08-06 MED ORDER — IBUPROFEN 600 MG PO TABS: 600.0000 mg | ORAL_TABLET | Freq: Four times a day (QID) | ORAL | 0 refills | Status: DC | PRN

## 2023-08-06 NOTE — MAU Note (Signed)
.  Sabrina Coleman is a 28 y.o. at [redacted]w[redacted]d here in MAU reporting: bleeding that filled up a pad at home and another upon arrival to unit and cramping. No c/o HA, chest pain or visual disturbances.   Onset of complaint: 1230 Pain score: 6 Vitals:   08/06/23 1306 08/06/23 1327  BP: 128/78 136/65  Pulse: 85 82  Resp: 18 16  Temp:    SpO2:  99%     FHT:na

## 2023-08-06 NOTE — MAU Note (Signed)
.  Sabrina Coleman is a 28 y.o. at Unknown here in MAU reporting: Has had vag bleeding but it had stopped. Today it has  started very heavy. Passed several  large clots changing pad every 20-30 min.  Had gone to abortion clinic last week and has an appointment for a termination tomorrow.  Cramping was worse at home better now.   LMP: 05/21/24 Onset of complaint: this morning Pain score: 3 Vitals:   08/06/23 1301 08/06/23 1306  BP:  128/78  Pulse:  85  Resp:  18  Temp: 97.9 F (36.6 C)      FHT:n/a Lab orders placed from triage: UPT

## 2023-08-06 NOTE — MAU Provider Note (Signed)
History     CSN: [id number]  Arrival date and time: 08/06/23 1152   Event Date/Time   First Provider Initiated Contact with Patient 08/06/23 1330      Chief Complaint  Patient presents with   Vaginal Bleeding   HPI Sabrina Coleman is a 28 y.o. [id number] at [redacted]w[redacted]d who presents for vaginal bleeding. Reports 2 episodes of post coital bleeding in the last week. Increase in bleeding this morning. States since this morning she has had to change her pad every 30 minutes & has been passing blood clots. Has had some intermittent cramping. Scheduled for TAB tomorrow. Denies concern or testing for STI.  Reports ultrasound at [facility] last week that showed an 8 week IUP.   OB History     Gravida  4   Para  1   Term      Preterm  1   AB  2   Living  1      SAB      IAB  2   Ectopic      Multiple  0   Live Births  1           Past Medical History:  Diagnosis Date   Chlamydia    Hypertension    Infection    UTI    Past Surgical History:  Procedure Laterality Date   CESAREAN SECTION  11/30/2016   Procedure: CESAREAN SECTION;  Surgeon: Sabrina Phenix, MD;  Location: [facility] BIRTHING SUITES;  Service: Obstetrics;;   INDUCED ABORTION     WISDOM TOOTH EXTRACTION      Family History  Problem Relation Age of Onset   Asthma Brother    Cancer Maternal Grandmother    Asthma Brother     Social History   Tobacco Use   Smoking status: Former    Types: Cigars   Smokeless tobacco: Never  Vaping Use   Vaping status: Never Used  Substance Use Topics   Alcohol use: No   Drug use: No    Comment: no longer    Allergies: No Known Allergies  No medications prior to admission.    Review of Systems  All other systems reviewed and are negative.  Physical Exam   Blood pressure 136/65, pulse 82, temperature 97.9 F (36.6 C), resp. rate 16, height 5\' 6"  (1.676 m), last menstrual period 05/22/2023, SpO2 99%.  Physical Exam Vitals and nursing note reviewed. Exam  conducted with a chaperone present.  Constitutional:      General: She is not in acute distress.    Appearance: She is well-developed. She is not ill-appearing.  HENT:     Head: Normocephalic and atraumatic.  Eyes:     General: No scleral icterus.       Right eye: No discharge.        Left eye: No discharge.     Conjunctiva/sclera: Conjunctivae normal.  Pulmonary:     Effort: Pulmonary effort is normal. No respiratory distress.  Genitourinary:    General: Normal vulva.     Exam position: Lithotomy position.     Cervix: Cervical bleeding present.     Comments: Moderate amount of dark red blood & few large clots removed from vagina. Cervix visually closed & no active bleeding from os.  Neurological:     General: No focal deficit present.     Mental Status: She is alert.  Psychiatric:        Mood and Affect: Mood normal.  Behavior: Behavior normal.     MAU Course  Procedures Results for orders placed or performed during the hospital encounter of 08/06/23 (from the past 24 hours)  Pregnancy, urine POC     Status: Abnormal   Collection Time: 08/06/23 12:56 PM  Result Value Ref Range   Preg Test, Ur POSITIVE (A) NEGATIVE  CBC     Status: None   Collection Time: 08/06/23  1:22 PM  Result Value Ref Range   WBC 9.2 4.0 - 10.5 K/uL   RBC 3.96 3.87 - 5.11 MIL/uL   Hemoglobin 12.5 12.0 - 15.0 g/dL   HCT 16.1 09.6 - 04.5 %   MCV 92.4 80.0 - 100.0 fL   MCH 31.6 26.0 - 34.0 pg   MCHC 34.2 30.0 - 36.0 g/dL   RDW 40.9 81.1 - 91.4 %   Platelets 318 150 - 400 K/uL   nRBC 0.0 0.0 - 0.2 %  hCG, quantitative, pregnancy     Status: Abnormal   Collection Time: 08/06/23  1:22 PM  Result Value Ref Range   hCG, Beta Chain, Quant, S 8,045 (H) <5 mIU/mL   US OB LESS THAN 14 WEEKS WITH OB TRANSVAGINAL Result Date: 08/06/2023 CLINICAL DATA:  Pregnant patient with vaginal bleeding. EXAM: OBSTETRIC <14 WK [place] AND TRANSVAGINAL OB US TECHNIQUE: Both transabdominal and transvaginal ultrasound  examinations were performed for complete evaluation of the gestation as well as the maternal uterus, adnexal regions, and pelvic cul-de-sac. Transvaginal technique was performed to assess early pregnancy. COMPARISON:  None Available. FINDINGS: Intrauterine gestational sac: None Yolk sac:  Not Visualized. Embryo:  Not Visualized. Cardiac Activity: Not Visualized. Maternal uterus/adnexae: Normal ovaries bilaterally. No free fluid in the pelvis. No adnexal mass. Thickened heterogeneous endometrium measuring up to 26 mm with associated vascularity. IMPRESSION: No intrauterine gestation identified. In the setting of positive pregnancy test and no definite intrauterine pregnancy, this reflects a pregnancy of unknown location. Differential considerations include early normal IUP, abnormal IUP, or nonvisualized ectopic pregnancy. Differentiation is achieved with serial beta HCG supplemented by repeat sonography as clinically warranted. Nonspecific heterogeneous thickened endometrium. Recommend continued attention on short-term follow-up exam. Electronically Signed   By: Annia Belt M.D.   On: 08/06/2023 14:47    MDM Labs & imaging ordered No meds given  Assessment and Plan   1. Miscarriage at 8 to [redacted] weeks gestation   2. [redacted] weeks gestation of pregnancy    -Patient reports 8 wk IUP on ultrasound last week. Today, ultrasound shows no IUP, thickened endometrium. Findings consistent with miscarriage. Reviewed results with Dr. Vergie Living. Patient is stable for discharge. Will give dose of cytotec & have patient f/u for imaging & appointment in 7-10 days -RH positive -Reviewed return precautions including signs of hemorrhage & infection -Pelvic rest reviewed  [name] 08/06/2023, 4:04 PM

## 2023-08-06 NOTE — Discharge Instructions (Signed)
Cytotec for Pregnancy Failure FACTS YOU SHOULD KNOW  WHAT IS AN EARLY PREGNANCY FAILURE? Once the egg is fertilized with the sperm and begins to develop, it attaches to the lining of the uterus. This early pregnancy tissue may not develop into an embryo (the beginning stage of a baby). Sometimes an embryo does develop but does not continue to grow. These problems can be seen on ultrasound.   MANAGEMNT OF EARLY PREGNANCY FAILURE: About 4 out of 100 (0.25%) women will have a pregnancy loss in her lifetime.  One in five pregnancies is found to be an early pregnancy failure.  There are 3 ways to care for an early pregnancy failure:   Surgery, (2) Medicine, (3) Waiting for you to pass the pregnancy on your own. The decision as to how to proceed after being diagnosed with and early pregnancy failure is an individual one.  The decision can be made only after appropriate counseling.  You need to weigh the pros and cons of the 3 choices. Then you can make the choice that works for you. SURGERY (D&E) Procedure over in 1 day Requires being put to sleep Bleeding may be light Possible problems during surgery, including injury to womb(uterus) Care provider has more control Medicine (CYTOTEC) The complete procedure may take days to weeks No Surgery Bleeding may be heavy at times There may be drug side effects Patient has more control Waiting You may choose to wait, in which case your own body may complete the passing of the abnormal early pregnancy on its own in about 2-4 weeks Your bleeding may be heavy at times There is a small possibility that you may need surgery if the bleeding is too much or not all of the pregnancy has passed. CYTOTEC MANAGEMENT Prostaglandins (cytotec) are the most widely used drug for this purpose. They cause the uterus to cramp and contract. You will place the medicine yourself inside your vagina in the privacy of your home. Empting of the uterus should occur within 3 days but  the process may continue for several weeks. The bleeding may seem heavy at times. POSSIBLE SIDE EFFECTS FROM CYTOTEC Nausea   Vomiting Diarrhea Fever Chills  Hot Flashes Side effects  from the process of the early pregnancy failure include: Cramping  Bleeding Headaches  Dizziness RISKS: This is a low risk procedure. Less than 1 in 100 women has a complication. An incomplete passage of the early pregnancy may occur. Also, Hemorrhage (heavy bleeding) could happen.  Rarely the pregnancy will not be passed completely. Excessively heavy bleeding may occur.  Your doctor may need to perform surgery to empty the uterus (D&E). Afterwards: Everybody will feel differently after the early pregnancy completion. You may have soreness or cramps for a day or two. You may have soreness or cramps for day or two.  You may have light bleeding for up to 2 weeks. You may be as active as you feel like being. If you have any of the following problems you may call Maternity Admissions Unit at [phone]. If you have pain that does not get better  with pain medication Bleeding that soaks through 2 thick full-sized sanitary pads in an hour Cramps that last longer than 2 days Foul smelling discharge Fever above 100.4 degrees F Even if you do not have any of these symptoms, you should have a follow-up exam to make sure you are healing properly. This appointment will be made for you before you leave the hospital. Your next normal period will  start again in 4-6 week after the loss. You can get pregnant soon after the loss, so use birth control right away. Finally: Make sure all your questions are answered before during and after any procedure. Follow up with medical care and family planning methods.     

## 2023-08-07 ENCOUNTER — Encounter: Payer: Self-pay | Admitting: Obstetrics

## 2023-08-09 ENCOUNTER — Encounter (HOSPITAL_COMMUNITY): Payer: Self-pay | Admitting: Obstetrics & Gynecology

## 2023-08-09 ENCOUNTER — Inpatient Hospital Stay (HOSPITAL_COMMUNITY): Payer: BC Managed Care – PPO

## 2023-08-09 ENCOUNTER — Inpatient Hospital Stay (HOSPITAL_COMMUNITY)
Admission: AD | Admit: 2023-08-09 | Discharge: 2023-08-09 | Disposition: A | Discharge status: Home / Self Care | Payer: BC Managed Care – PPO | Attending: Family Medicine | Admitting: Family Medicine

## 2023-08-09 DIAGNOSIS — Z3A00 Weeks of gestation of pregnancy not specified: Secondary | ICD-10-CM | POA: Diagnosis not present

## 2023-08-09 DIAGNOSIS — N939 Abnormal uterine and vaginal bleeding, unspecified: Secondary | ICD-10-CM

## 2023-08-09 DIAGNOSIS — Z3A11 11 weeks gestation of pregnancy: Secondary | ICD-10-CM | POA: Diagnosis not present

## 2023-08-09 DIAGNOSIS — O26891 Other specified pregnancy related conditions, first trimester: Secondary | ICD-10-CM | POA: Diagnosis not present

## 2023-08-09 DIAGNOSIS — R109 Unspecified abdominal pain: Secondary | ICD-10-CM | POA: Diagnosis not present

## 2023-08-09 DIAGNOSIS — O209 Hemorrhage in early pregnancy, unspecified: Secondary | ICD-10-CM | POA: Diagnosis not present

## 2023-08-09 DIAGNOSIS — O039 Complete or unspecified spontaneous abortion without complication: Secondary | ICD-10-CM | POA: Insufficient documentation

## 2023-08-09 DIAGNOSIS — D259 Leiomyoma of uterus, unspecified: Secondary | ICD-10-CM | POA: Diagnosis not present

## 2023-08-09 DIAGNOSIS — O3680X0 Pregnancy with inconclusive fetal viability, not applicable or unspecified: Secondary | ICD-10-CM | POA: Diagnosis not present

## 2023-08-09 DIAGNOSIS — O034 Incomplete spontaneous abortion without complication: Secondary | ICD-10-CM | POA: Diagnosis not present

## 2023-08-09 LAB — CBC
HCT: 26.5 % — ABNORMAL LOW (ref 36.0–46.0)
Hemoglobin: 9 g/dL — ABNORMAL LOW (ref 12.0–15.0)
MCH: 31.4 pg (ref 26.0–34.0)
MCHC: 34 g/dL (ref 30.0–36.0)
MCV: 92.3 fL (ref 80.0–100.0)
Platelets: 289 10*3/uL (ref 150–400)
RBC: 2.87 MIL/uL — ABNORMAL LOW (ref 3.87–5.11)
RDW: 12.4 % (ref 11.5–15.5)
WBC: 11.2 10*3/uL — ABNORMAL HIGH (ref 4.0–10.5)
nRBC: 0 % (ref 0.0–0.2)

## 2023-08-09 LAB — HCG, QUANTITATIVE, PREGNANCY: hCG, Beta Chain, Quant, S: 1772 m[IU]/mL — ABNORMAL HIGH (ref <5)

## 2023-08-09 MED ORDER — ONDANSETRON 4 MG PO TBDP
4.0000 mg | ORAL_TABLET | Freq: Once | ORAL | Status: AC
  Administered 2023-08-09: 4 mg via ORAL
  Filled 2023-08-09: qty 1

## 2023-08-09 MED ORDER — LORAZEPAM 2 MG/ML IJ SOLN
0.5000 mg | Freq: Once | INTRAMUSCULAR | Status: AC
  Administered 2023-08-09: 0.5 mg via INTRAVENOUS
  Filled 2023-08-09: qty 1

## 2023-08-09 MED ORDER — KETOROLAC TROMETHAMINE 30 MG/ML IJ SOLN
30.0000 mg | Freq: Once | INTRAMUSCULAR | Status: AC
  Administered 2023-08-09: 30 mg via INTRAVENOUS
  Filled 2023-08-09: qty 1

## 2023-08-09 MED ORDER — DOXYCYCLINE HYCLATE 100 MG IV SOLR
200.0000 mg | Freq: Once | INTRAVENOUS | Status: AC
  Administered 2023-08-09: 200 mg via INTRAVENOUS
  Filled 2023-08-09: qty 200

## 2023-08-09 MED ORDER — HYDROMORPHONE HCL 1 MG/ML IJ SOLN
1.0000 mg | Freq: Once | INTRAMUSCULAR | Status: AC
  Administered 2023-08-09: 1 mg via INTRAVENOUS
  Filled 2023-08-09: qty 1

## 2023-08-09 MED ORDER — LACTATED RINGERS IV BOLUS
1000.0000 mL | Freq: Once | INTRAVENOUS | Status: AC
  Administered 2023-08-09: 1000 mL via INTRAVENOUS

## 2023-08-09 MED ORDER — FERROUS SULFATE 325 (65 FE) MG PO TBEC: 325.0000 mg | DELAYED_RELEASE_TABLET | Freq: Every day | ORAL | 3 refills | Status: AC

## 2023-08-09 MED ORDER — OXYCODONE HCL 5 MG PO TABS: 5.0000 mg | ORAL_TABLET | Freq: Three times a day (TID) | ORAL | 0 refills | Status: DC | PRN

## 2023-08-09 MED ORDER — FENTANYL CITRATE (PF) 100 MCG/2ML IJ SOLN
50.0000 ug | Freq: Once | INTRAMUSCULAR | Status: AC
  Administered 2023-08-09: 50 ug via INTRAVENOUS
  Filled 2023-08-09: qty 2

## 2023-08-09 MED ORDER — LIDOCAINE HCL (PF) 1 % IJ SOLN
20.0000 mL | Freq: Once | INTRAMUSCULAR | Status: AC
  Administered 2023-08-09: 20 mL
  Filled 2023-08-09: qty 20

## 2023-08-09 MED ORDER — ONDANSETRON 4 MG PO TBDP: 4.0000 mg | ORAL_TABLET | Freq: Three times a day (TID) | ORAL | 0 refills | Status: DC | PRN

## 2023-08-09 MED ORDER — OXYCODONE HCL 5 MG PO TABS
10.0000 mg | ORAL_TABLET | ORAL | Status: AC
  Administered 2023-08-09: 10 mg via ORAL
  Filled 2023-08-09: qty 2

## 2023-08-09 NOTE — Progress Notes (Signed)
Dr. Crissie Reese and Dr. Lucianne Muss at bedside to complete cervical block. Pt educated and informed of procedure. Pt ok with plan to wait for cervix to numb before starting MVA procedure.

## 2023-08-09 NOTE — MAU Provider Note (Cosign Needed Addendum)
History     CSN: [id number]  Arrival date and time: 08/09/23 0830   Event Date/Time   First Provider Initiated Contact with Patient 08/09/2023  8:41 AM   Chief Complaint  Patient presents with   Vaginal Bleeding   Abdominal Pain   Nausea    HPI  Sabrina Coleman is a 28 y.o. [id number] at [redacted]w[redacted]d who presents to the MAU for vaginal bleeding in setting of recent SAB. Pt seen 1/16 for VB in setting of early pregnancy -- had IUP confirmed at [facility] -- was considering termination of pregnancy. She had VB and presented to MAU where IUP no longer visualized c/w SAB. She took Cytotec as Rx'ed and started experiencing heavy bleeding and cramping. She has taken Cytotec for Ab in past, never had pain and bleeding this heavy. Has saturated through approx one pad an hour and has been passing greater than golf ball sized clots. She had some bleeding into her pants as well. Rates pain as unbearable, 10/10. No fevers or chills. Feeling lightheaded. Endorses nausea.  Past Medical History:  Diagnosis Date   Chlamydia    Hypertension    Infection    UTI    Past Surgical History:  Procedure Laterality Date   CESAREAN SECTION  11/30/2016   Procedure: CESAREAN SECTION;  Surgeon: Adam Phenix, MD;  Location: [facility] BIRTHING SUITES;  Service: Obstetrics;;   INDUCED ABORTION     WISDOM TOOTH EXTRACTION      Family History  Problem Relation Age of Onset   Asthma Brother    Cancer Maternal Grandmother    Asthma Brother     Social History   Tobacco Use   Smoking status: Former    Types: Cigars   Smokeless tobacco: Never  Vaping Use   Vaping status: Never Used  Substance Use Topics   Alcohol use: No   Drug use: No    Comment: no longer    Allergies: No Known Allergies  Medications Prior to Admission  Medication Sig Dispense Refill Last Dose/Taking   ibuprofen (ADVIL) 600 MG tablet Take 1 tablet (600 mg total) by mouth every 6 (six) hours as needed for cramping or moderate pain (pain  score 4-6). 30 tablet 0 08/09/2023 at  6:00 AM   misoprostol (CYTOTEC) 200 MCG tablet Take 4 tablets (800 mcg total) by mouth once for 1 dose. 4 tablet 0     ROS reviewed and pertinent positives and negatives as documented in HPI.  Physical Exam   Blood pressure 128/73, pulse 82, temperature (!) 97.5 F (36.4 C), temperature source Oral, resp. rate (!) 22, last menstrual period 05/22/2023, SpO2 100%.  Physical Exam Exam conducted with a chaperone present.  Constitutional:      General: She is not in acute distress.    Appearance: Normal appearance. She is not ill-appearing.  HENT:     Head: Normocephalic and atraumatic.  Cardiovascular:     Rate and Rhythm: Normal rate.  Pulmonary:     Effort: Pulmonary effort is normal.     Breath sounds: Normal breath sounds.  Abdominal:     Palpations: Abdomen is soft.     Tenderness: There is no abdominal tenderness. There is no guarding.  Genitourinary:    Comments: On speculum exam, cervix visually dilated with several clots and dark red blood in the vaginal vault. POC at external os, easily removed w ring forceps. Following removal of POC from cervix, cx visually dilated to approx 2cm. Cervix otherwise w/o any  lesions  Musculoskeletal:        General: Normal range of motion.  Skin:    General: Skin is warm and dry.     Findings: No rash.  Neurological:     General: No focal deficit present.     Mental Status: She is alert and oriented to person, place, and time.     MAU Course  Procedures    MANUAL VACUUM ASPIRATION PROCEDURE NOTE  Location of Procedure: MAU   Sabrina Coleman is 28 y.o. [phone] presenting for scheduled MVA procedure  PROCEDURE DATE: 08/09/2023  PREOPERATIVE DIAGNOSIS: [redacted]w[redacted]d week by LMP diagnosed with Incomplete abortion on 08/06/23 POSTOPERATIVE DIAGNOSIS: The same PROCEDURE:   MANUAL VACUUM ASPIRATION under ULTRASOUND GUIDANCE SURGEON:  Tonatiuh Mallon  INDICATIONS: 28 y.o. [id number] with Incomplete abortion  at [redacted] weeks gestation, needing surgical completion.  Risks of surgery were discussed with the patient including but not limited to: bleeding which may require transfusion; infection which may require antibiotics; injury to uterus or surrounding organs; need for additional procedures including laparotomy or laparoscopy; possibility of intrauterine scarring which may impair future fertility; and other postoperative/anesthesia complications. Written informed consent was obtained.    FINDINGS:  A 11 week size uterus, moderate amounts of products of conception, specimen sent to pathology. POC also sent for genetic testing with Anora.  ANESTHESIA: Paracervical Block 20mL 1% lidocaine ESTIMATED BLOOD LOSS:  Less than 20 ml. SPECIMENS:  Products of conception sent to pathology and for genetic testing w Anora. COMPLICATIONS:  None immediate.  PROCEDURE DETAILS:  Prior to the start of the procedure the uterus was examined using ultrasound to confirm failed pregnancy. The patient received IV Doxycycline 200mg  prior to the following the procedure. Additionally she was given IV Ativan 0.5mg  and IV Fentanyl all 10 minutes prior to the procedure.  After an adequate timeout was performed, she was placed in the dorsal lithotomy position and examined.  A vaginal speculum was then placed in the patient's vagina and the vagina and cervix were cleaned using Betadinex3. A paracervical block using 20 ml of 0.5% Lidocaine was administered. The cervix was gently dilated to accommodate a 10 mm suction tube under ultrasound guidance. The suction curette was advanced gently advanced to the uterine fundus. The MVA apparatus was activated to create adequate suction and curette slowly rotated to clear the uterus of products of conception.  Ultrasound was used to assure removal of products of conception. There was minimal bleeding noted with good hemostasis noted.   All instruments were removed from the patient's vagina.  The  patient tolerated the procedure well and was observed for at least 15 minutes prior to leaving.   08/09/2023 4:14 PM   MDM 27 y.o. [id number] at [redacted]w[redacted]d presenting for vaginal bleeding in setting of incomplete abortion. Had POC on evaluation, hemodynamically stable. Given pain control with IV Dilaudid and Toradol initially w significant relief of symptoms. She also received Zofran for nausea. Once symptoms better controlled, able to pull POC from cervix -- to be sent for Anora testing per pt request. U/S with POC all the way up to fundus, for which repeat Cytotec vs MVA vs outpatient D&C offered to pt. Pt elected for MVA, see procedure note above. Given IV Doxycycline. Discharged w return precautions, Roxicodone, Zofran, and advised to f/up in clinic in 2-3 weeks to review Anora results. Stable for d/c.   Assessment and Plan  Vaginal bleeding HgB 12.5 > 9.0 Start Fe Bleeding improved, expect improvement of bleeding Consider recheck  at office visit  Spontaneous abortion S/p Cytotec MVA today Doxycycline x 1 dose given Rx for Roxicodone and Zofran sent POC sent to surgical pathology and for Anora testing   Sundra Aland, MD OB Fellow, Faculty Practice [facility], [facility]  08/09/2023, 4:10 PM

## 2023-08-09 NOTE — MAU Note (Signed)
..  Sabrina Coleman is a 28 y.o. at [redacted]w[redacted]d here in MAU reporting: took cytotec x3 days ago, states she's saturated through multiple pads over the last few days and the pain is unbearable. States she has saturated a pad in the last hour and it went through her pants. Is also passing large clots.   Patient pacing in triage due to pain.   Pain score: 10 Vitals:   08/09/23 0840  BP: 108/85  Pulse: 100  Resp: (!) 22  Temp: (!) 97.5 F (36.4 C)  SpO2: 100%

## 2023-08-11 LAB — SURGICAL PATHOLOGY

## 2023-08-11 NOTE — Progress Notes (Signed)
Surgical pathology report -- POC.

## 2023-08-18 ENCOUNTER — Ambulatory Visit (HOSPITAL_COMMUNITY): Admission: RE | Admit: 2023-08-18 | Payer: BC Managed Care – PPO | Source: Ambulatory Visit

## 2023-08-24 ENCOUNTER — Encounter: Payer: Self-pay | Admitting: Obstetrics and Gynecology

## 2023-08-24 ENCOUNTER — Other Ambulatory Visit (HOSPITAL_COMMUNITY)
Admission: RE | Admit: 2023-08-24 | Discharge: 2023-08-24 | Disposition: A | Discharge status: Home / Self Care | Payer: BC Managed Care – PPO | Source: Ambulatory Visit | Attending: Obstetrics and Gynecology | Admitting: Obstetrics and Gynecology

## 2023-08-24 ENCOUNTER — Ambulatory Visit: Payer: BC Managed Care – PPO | Admitting: Obstetrics and Gynecology

## 2023-08-24 VITALS — BP 113/73 | HR 85 | Ht 66.0 in | Wt 248.0 lb

## 2023-08-24 DIAGNOSIS — O039 Complete or unspecified spontaneous abortion without complication: Secondary | ICD-10-CM

## 2023-08-24 DIAGNOSIS — Z124 Encounter for screening for malignant neoplasm of cervix: Secondary | ICD-10-CM

## 2023-08-24 DIAGNOSIS — Z3A01 Less than 8 weeks gestation of pregnancy: Secondary | ICD-10-CM | POA: Diagnosis not present

## 2023-08-24 NOTE — Progress Notes (Signed)
SAB follow Up, reports no concerns today.

## 2023-08-24 NOTE — Progress Notes (Signed)
28 yo [id number] with LMP 05/22/23 and BMI 40 presenting for follow up on recent spontaneous miscarriage on 08/09/23. Patient reports feeling well. She reports resolution of cramping and vaginal bleeding. She is not planning on conceiving and plans to start birth control pills previously prescribed. She is without any complaints  Past Medical History:  Diagnosis Date   Chlamydia    Hypertension    Infection    UTI   Past Surgical History:  Procedure Laterality Date   CESAREAN SECTION  11/30/2016   Procedure: CESAREAN SECTION;  Surgeon: Adam Phenix, MD;  Location: [facility] BIRTHING SUITES;  Service: Obstetrics;;   INDUCED ABORTION     WISDOM TOOTH EXTRACTION     Family History  Problem Relation Age of Onset   Asthma Brother    Cancer Maternal Grandmother    Asthma Brother    Social History   Tobacco Use   Smoking status: Former    Types: Cigars   Smokeless tobacco: Never  Vaping Use   Vaping status: Never Used  Substance Use Topics   Alcohol use: No   Drug use: No    Comment: no longer   ROS See pertinent in HPI. All other systems reviewed and non contributory Blood pressure 113/73, pulse 85, height 5\' 6"  (1.676 m), weight 248 lb (112.5 kg), last menstrual period 05/22/2023, not currently breastfeeding. GENERAL: Well-developed, well-nourished female in no acute distress.  ABDOMEN: Soft, nontender, nondistended. No organomegaly. PELVIC: Normal external female genitalia. Vagina is pink and rugated.  Normal discharge. Normal appearing cervix. Uterus is normal in size. No adnexal mass or tenderness. Chaperone present during the pelvic exam EXTREMITIES: No cyanosis, clubbing, or edema, 2+ distal pulses.   A/P 28 yo here for follow up on recent spontaneous miscarriage - Pap smear collected - Patient cleared to start birth control pills and return to regular activity - Patient will be contacted with abnormal results

## 2023-08-28 LAB — CYTOLOGY - PAP
Comment: NEGATIVE
Diagnosis: UNDETERMINED — AB
High risk HPV: POSITIVE — AB

## 2023-09-17 ENCOUNTER — Other Ambulatory Visit (HOSPITAL_COMMUNITY)
Admission: RE | Admit: 2023-09-17 | Discharge: 2023-09-17 | Disposition: A | Discharge status: Home / Self Care | Source: Ambulatory Visit | Attending: Obstetrics and Gynecology | Admitting: Obstetrics and Gynecology

## 2023-09-17 ENCOUNTER — Other Ambulatory Visit (HOSPITAL_COMMUNITY)
Admission: RE | Admit: 2023-09-17 | Discharge: 2023-09-17 | Disposition: A | Discharge status: Home / Self Care | Payer: BC Managed Care – PPO | Source: Ambulatory Visit | Attending: Obstetrics and Gynecology | Admitting: Obstetrics and Gynecology

## 2023-09-17 ENCOUNTER — Ambulatory Visit: Payer: BC Managed Care – PPO | Admitting: Obstetrics and Gynecology

## 2023-09-17 ENCOUNTER — Encounter: Payer: Self-pay | Admitting: Obstetrics and Gynecology

## 2023-09-17 VITALS — BP 126/77 | HR 82 | Ht 66.0 in | Wt 250.0 lb

## 2023-09-17 DIAGNOSIS — Z3202 Encounter for pregnancy test, result negative: Secondary | ICD-10-CM | POA: Diagnosis not present

## 2023-09-17 DIAGNOSIS — Z113 Encounter for screening for infections with a predominantly sexual mode of transmission: Secondary | ICD-10-CM

## 2023-09-17 DIAGNOSIS — N72 Inflammatory disease of cervix uteri: Secondary | ICD-10-CM | POA: Diagnosis not present

## 2023-09-17 DIAGNOSIS — R87610 Atypical squamous cells of undetermined significance on cytologic smear of cervix (ASC-US): Secondary | ICD-10-CM | POA: Insufficient documentation

## 2023-09-17 DIAGNOSIS — Z23 Encounter for immunization: Secondary | ICD-10-CM | POA: Diagnosis not present

## 2023-09-17 DIAGNOSIS — N870 Mild cervical dysplasia: Secondary | ICD-10-CM | POA: Diagnosis not present

## 2023-09-17 DIAGNOSIS — R87810 Cervical high risk human papillomavirus (HPV) DNA test positive: Secondary | ICD-10-CM

## 2023-09-17 LAB — POCT URINE PREGNANCY: Preg Test, Ur: NEGATIVE

## 2023-09-17 NOTE — Addendum Note (Signed)
 Addended by: Maretta Bees on: 09/17/2023 12:14 PM   Modules accepted: Orders

## 2023-09-17 NOTE — Progress Notes (Addendum)
 29 y.o. GYN presents for COLPO. + HIGH RISK HPV / Atypical squamous cells of undetermined significance (ASC-US) Abnormal    UPT  Negative  Gardasil Injection given in [facility], tolerated well.  2nd due in 2 months, 3rd 6 months.

## 2023-09-17 NOTE — Progress Notes (Signed)
 28 yo presenting today for scheduled colposcopy. Patient with abnormal pap smear 08/2023 consisting of ASCUS + HRHPV. Patient also desires STI screening  Patient given informed consent, signed copy in the chart, time out was performed.  Placed in lithotomy position. Cervix viewed with speculum and colposcope after application of acetic acid.   Colposcopy adequate?  yes Acetowhite lesions?12-3 and 5-7 o'clock Punctation?no Mosaicism?  no Abnormal vasculature?  no Biopsies?2 and 6 o'clock ECC?yes  COMMENTS: Patient was given post procedure instructions.  She will return in 2 weeks for results. Discussed benefits of Gardasil vaccine  Catalina Antigua, MD

## 2023-09-18 LAB — HEPATITIS C ANTIBODY: Hep C Virus Ab: NONREACTIVE

## 2023-09-18 LAB — HIV ANTIBODY (ROUTINE TESTING W REFLEX): HIV Screen 4th Generation wRfx: NONREACTIVE

## 2023-09-18 LAB — CERVICOVAGINAL ANCILLARY ONLY
Chlamydia: NEGATIVE
Comment: NEGATIVE
Comment: NORMAL
Neisseria Gonorrhea: NEGATIVE

## 2023-09-18 LAB — HEPATITIS B SURFACE ANTIGEN: Hepatitis B Surface Ag: NEGATIVE

## 2023-09-18 LAB — RPR: RPR Ser Ql: NONREACTIVE

## 2023-09-21 LAB — SURGICAL PATHOLOGY

## 2023-12-22 ENCOUNTER — Ambulatory Visit

## 2024-02-23 ENCOUNTER — Ambulatory Visit (INDEPENDENT_AMBULATORY_CARE_PROVIDER_SITE_OTHER)

## 2024-02-23 VITALS — BP 137/77 | HR 89 | Ht 66.0 in | Wt 251.0 lb

## 2024-02-23 DIAGNOSIS — Z3201 Encounter for pregnancy test, result positive: Secondary | ICD-10-CM | POA: Diagnosis not present

## 2024-02-23 LAB — POCT URINE PREGNANCY: Preg Test, Ur: POSITIVE — AB

## 2024-02-23 NOTE — Progress Notes (Cosign Needed Addendum)
 Sabrina Coleman presents today for UPT. She has no unusual complaints.  LMP: 01/21/2024    OBJECTIVE: Appears well, in no apparent distress.  OB History     Gravida  5   Para  1   Term      Preterm  1   AB  2   Living  1      SAB      IAB  2   Ectopic      Multiple  0   Live Births  1          Home UPT Result: POSITIVE X2 In-Office UPT result: POSITIVE  I have reviewed the patient's medical, obstetrical, social, and family histories, and medications.   ASSESSMENT: Positive pregnancy test LMP  01/21/2024 EDD  10/27/2024 GA     [redacted]w[redacted]d  PLAN Prenatal care to be completed at: [facility]

## 2024-03-22 ENCOUNTER — Encounter

## 2024-04-05 ENCOUNTER — Encounter: Admitting: Obstetrics and Gynecology

## 2024-04-22 ENCOUNTER — Ambulatory Visit

## 2024-04-28 ENCOUNTER — Ambulatory Visit: Admitting: Obstetrics

## 2024-04-28 ENCOUNTER — Ambulatory Visit

## 2024-05-06 ENCOUNTER — Ambulatory Visit: Admitting: Obstetrics and Gynecology

## 2024-05-27 ENCOUNTER — Encounter: Payer: Self-pay | Admitting: Obstetrics

## 2024-05-27 ENCOUNTER — Other Ambulatory Visit (HOSPITAL_COMMUNITY)
Admission: RE | Admit: 2024-05-27 | Discharge: 2024-05-27 | Disposition: A | Discharge status: Home / Self Care | Source: Ambulatory Visit | Attending: Obstetrics | Admitting: Obstetrics

## 2024-05-27 ENCOUNTER — Ambulatory Visit: Admitting: Obstetrics

## 2024-05-27 VITALS — BP 129/78 | HR 74 | Wt 250.0 lb

## 2024-05-27 DIAGNOSIS — Z6841 Body Mass Index (BMI) 40.0 and over, adult: Secondary | ICD-10-CM

## 2024-05-27 DIAGNOSIS — Z708 Other sex counseling: Secondary | ICD-10-CM

## 2024-05-27 DIAGNOSIS — N898 Other specified noninflammatory disorders of vagina: Secondary | ICD-10-CM

## 2024-05-27 DIAGNOSIS — Z23 Encounter for immunization: Secondary | ICD-10-CM | POA: Diagnosis not present

## 2024-05-27 DIAGNOSIS — E66813 Obesity, class 3: Secondary | ICD-10-CM

## 2024-05-27 DIAGNOSIS — O039 Complete or unspecified spontaneous abortion without complication: Secondary | ICD-10-CM

## 2024-05-27 DIAGNOSIS — Z113 Encounter for screening for infections with a predominantly sexual mode of transmission: Secondary | ICD-10-CM | POA: Diagnosis not present

## 2024-05-27 DIAGNOSIS — N946 Dysmenorrhea, unspecified: Secondary | ICD-10-CM | POA: Diagnosis not present

## 2024-05-27 DIAGNOSIS — Z3041 Encounter for surveillance of contraceptive pills: Secondary | ICD-10-CM | POA: Diagnosis not present

## 2024-05-27 MED ORDER — LEVONORGESTREL-ETHINYL ESTRAD 0.1-20 MG-MCG PO TABS: 1.0000 | ORAL_TABLET | Freq: Every day | ORAL | 11 refills | Status: AC

## 2024-05-27 MED ORDER — IBUPROFEN 800 MG PO TABS: 800.0000 mg | ORAL_TABLET | Freq: Three times a day (TID) | ORAL | 5 refills | Status: AC | PRN

## 2024-05-27 NOTE — Progress Notes (Signed)
 Pt is in office for SAB follow up.  Pt would like std screening and ?need for Gardasil.  Pt would like to get back on [facility] pills-previously on [place] 28  Gardasil given at today's appt.  Pt tolerated injection well. Per provider, pt should restart series - this will be her 1st injection.

## 2024-05-27 NOTE — Progress Notes (Signed)
 Patient ID: Sabrina Coleman, female   DOB: April 25, 1996, 28 y.o.   MRN: [id number]  Chief Complaint  Patient presents with   Follow-up    HPI Sabrina Coleman is a 28 y.o. female.  Presents for follow up after recent 1st trimester SAB, complete.  No complaints. HPI  Past Medical History:  Diagnosis Date   Chlamydia    Hypertension    Infection    UTI    Past Surgical History:  Procedure Laterality Date   CESAREAN SECTION  11/30/2016   Procedure: CESAREAN SECTION;  Surgeon: Eveline Lynwood MATSU, MD;  Location: [facility] BIRTHING SU[facility];  Service: Obstetrics;;   INDUCED ABORTION     WISDOM TOOTH EXTRACTION      Family History  Problem Relation Age of Onset   Asthma Brother    Cancer Maternal Grandmother    Asthma Brother     Social History Social History   Tobacco Use   Smoking status: Former    Types: Cigars   Smokeless tobacco: Never  Vaping Use   Vaping status: Never Used  Substance Use Topics   Alcohol use: No   Drug use: No    Comment: no longer    No Known Allergies  Current Outpatient Medications  Medication Sig Dispense Refill   ibuprofen  (ADVIL ) 800 MG tablet Take 1 tablet (800 mg total) by mouth every 8 (eight) hours as needed. 30 tablet 5   levonorgestrel-ethinyl estradiol  (ALESSE) 0.1-20 MG-MCG tablet Take 1 tablet by mouth daily. 28 tablet 11   ferrous sulfate  325 (65 FE) MG EC tablet Take 1 tablet (325 mg total) by mouth daily with breakfast. (Patient not taking: Reported on 02/23/2024) 90 tablet 3   No current facility-administered medications for this visit.    Review of Systems Review of Systems Constitutional: negative for fatigue and weight loss Respiratory: negative for cough and wheezing Cardiovascular: negative for chest pain, fatigue and palpitations Gastrointestinal: negative for abdominal pain and change in bowel habits Genitourinary: positive for vaginal discharge Integument/breast: negative for nipple discharge Musculoskeletal:negative for  myalgias Neurological: negative for gait problems and tremors Behavioral/Psych: negative for abusive relationship, depression Endocrine: negative for temperature intolerance      Blood pressure 129/78, pulse 74, weight 250 lb (113.4 kg), not currently breastfeeding.  Physical Exam Physical Exam General:   Alert and no distress  Skin:   no rash or abnormalities  Lungs:   clear to auscultation bilaterally  Heart:   regular rate and rhythm, S1, S2 normal, no murmur, click, rub or gallop  Breasts:   Not examined  Abdomen:  normal findings: no organomegaly, soft, non-tender and no hernia  Pelvis:  External genitalia: normal general appearance Urinary system: urethral meatus normal and bladder without fullness, nontender Vaginal: normal without tenderness, induration or masses Cervix: normal appearance Adnexa: normal bimanual exam Uterus: anteverted and non-tender, normal size    I have spent a total of 20 minutes of face-to-face time, excluding clinical staff time, reviewing notes and preparing to see patient, ordering tests and/or medications, and counseling the patient.   Data Reviewed UPT  Assessment     1. SAB (spontaneous abortion) (Primary), complete - doing well  2. Vaginal discharge Rx: - Cervicovaginal ancillary only( ) - HIV Antibody (routine testing w rflx) - Hepatitis B surface antigen - RPR - Hepatitis C antibody  3. Encounter for surveillance of contraceptive pills Rx: - levonorgestrel-ethinyl estradiol  (ALESSE) 0.1-20 MG-MCG tablet; Take 1 tablet by mouth daily.  Dispense: 28 tablet; Refill: 11  4. Dysmenorrhea Rx: - ibuprofen  (ADVIL ) 800 MG tablet; Take 1 tablet (800 mg total) by mouth every 8 (eight) hours as needed.  Dispense: 30 tablet; Refill: 5  5. Class 3 severe obesity due to excess calories without serious comorbidity with body mass index (BMI) of 40.0 to 44.9 in adult (HCC) - weight reduction with the aid of dietary changes, exercise and  behavioral modification encouraged  6. Human papilloma virus (HPV) counseling - started Cardisil HPV vaccination in February but did not complete the series - will restart the vaccination series  7. Human papilloma virus (HPV) vaccine administered Rx: - HPV 9-valent vaccine,Recombinat ( 1st shot in series repeated )    Plan    Orders Placed This Encounter  Procedures   HPV 9-valent vaccine,Recombinat    1st shot in series.   HIV Antibody (routine testing w rflx)   Hepatitis B surface antigen   RPR   Hepatitis C antibody   Meds ordered this encounter  Medications   levonorgestrel-ethinyl estradiol  (ALESSE) 0.1-20 MG-MCG tablet    Sig: Take 1 tablet by mouth daily.    Dispense:  28 tablet    Refill:  11   ibuprofen  (ADVIL ) 800 MG tablet    Sig: Take 1 tablet (800 mg total) by mouth every 8 (eight) hours as needed.    Dispense:  30 tablet    Refill:  5      CARLIN RONAL CENTERS, MD, FACOG Attending Obstetrician & Gynecologist, [facility] for [facility], [facility], [place] 05/27/2024

## 2024-05-30 LAB — CERVICOVAGINAL ANCILLARY ONLY
Bacterial Vaginitis (gardnerella): POSITIVE — AB
Candida Glabrata: NEGATIVE
Candida Vaginitis: POSITIVE — AB
Chlamydia: NEGATIVE
Comment: NEGATIVE
Comment: NEGATIVE
Comment: NEGATIVE
Comment: NEGATIVE
Comment: NEGATIVE
Comment: NORMAL
Neisseria Gonorrhea: NEGATIVE
Trichomonas: NEGATIVE

## 2024-05-31 ENCOUNTER — Ambulatory Visit: Payer: Self-pay | Admitting: Obstetrics

## 2024-05-31 DIAGNOSIS — B9689 Other specified bacterial agents as the cause of diseases classified elsewhere: Secondary | ICD-10-CM

## 2024-05-31 DIAGNOSIS — B3731 Acute candidiasis of vulva and vagina: Secondary | ICD-10-CM

## 2024-05-31 MED ORDER — METRONIDAZOLE 500 MG PO TABS: 500.0000 mg | ORAL_TABLET | Freq: Two times a day (BID) | ORAL | 2 refills | Status: AC

## 2024-05-31 MED ORDER — FLUCONAZOLE 150 MG PO TABS: 150.0000 mg | ORAL_TABLET | Freq: Once | ORAL | 0 refills | Status: DC

## 2024-06-02 ENCOUNTER — Other Ambulatory Visit

## 2024-07-02 ENCOUNTER — Other Ambulatory Visit: Payer: Self-pay | Admitting: Obstetrics

## 2024-07-02 DIAGNOSIS — B3731 Acute candidiasis of vulva and vagina: Secondary | ICD-10-CM

## 2024-07-27 ENCOUNTER — Ambulatory Visit

## 2024-08-17 ENCOUNTER — Ambulatory Visit: Payer: Self-pay
